# Patient Record
Sex: Female | Born: 1960
Health system: Southern US, Community
[De-identification: ages and names within clinical notes are randomized; demographics above are authoritative.]

## PROBLEM LIST (undated history)

## (undated) DIAGNOSIS — E119 Type 2 diabetes mellitus without complications: Secondary | ICD-10-CM

## (undated) DIAGNOSIS — I10 Essential (primary) hypertension: Secondary | ICD-10-CM

## (undated) HISTORY — DX: Essential (primary) hypertension: I10

## (undated) HISTORY — DX: Type 2 diabetes mellitus without complications: E11.9

---

## 2001-12-04 ENCOUNTER — Emergency Department (HOSPITAL_COMMUNITY): Admission: EM | Admit: 2001-12-04 | Discharge: 2001-12-04 | Payer: Self-pay | Admitting: Emergency Medicine

## 2001-12-04 ENCOUNTER — Encounter: Payer: Self-pay | Admitting: Emergency Medicine

## 2002-01-17 ENCOUNTER — Encounter: Payer: Self-pay | Admitting: Family Medicine

## 2002-01-17 ENCOUNTER — Ambulatory Visit (HOSPITAL_COMMUNITY): Admission: RE | Admit: 2002-01-17 | Discharge: 2002-01-17 | Payer: Self-pay | Admitting: Family Medicine

## 2002-08-10 ENCOUNTER — Encounter: Payer: Self-pay | Admitting: Internal Medicine

## 2002-08-10 ENCOUNTER — Encounter: Admission: RE | Admit: 2002-08-10 | Discharge: 2002-08-10 | Payer: Self-pay | Admitting: Internal Medicine

## 2002-08-28 ENCOUNTER — Encounter: Admission: RE | Admit: 2002-08-28 | Discharge: 2002-08-28 | Payer: Self-pay | Admitting: Internal Medicine

## 2002-08-28 ENCOUNTER — Encounter: Payer: Self-pay | Admitting: Internal Medicine

## 2012-07-13 LAB — HEPATIC FUNCTION PANEL
ALT: 17 U/L (ref 7–35)
AST: 17 U/L (ref 13–35)
Alkaline Phosphatase: 75 U/L (ref 25–125)
Bilirubin, Total: 0.4 mg/dL

## 2012-07-13 LAB — BASIC METABOLIC PANEL
BUN: 16 mg/dL (ref 4–21)
Creatinine: 0.9 mg/dL (ref 0.5–1.1)

## 2012-07-13 LAB — TSH: TSH: 1.42 u[IU]/mL (ref 0.41–5.90)

## 2012-07-13 LAB — HEMOGLOBIN A1C: Hgb A1c MFr Bld: 7 % — AB (ref 4.0–6.0)

## 2012-07-13 LAB — LIPID PANEL
Cholesterol: 198 mg/dL (ref 0–200)
HDL: 74 mg/dL — AB (ref 35–70)
LDL Cholesterol: 107 mg/dL
Triglycerides: 84 mg/dL (ref 40–160)

## 2012-07-13 LAB — CBC AND DIFFERENTIAL
Platelets: 298 10*3/uL (ref 150–399)
WBC: 4.2 10^3/mL

## 2013-01-10 ENCOUNTER — Ambulatory Visit: Payer: Self-pay | Admitting: Family Medicine

## 2013-01-11 ENCOUNTER — Ambulatory Visit (INDEPENDENT_AMBULATORY_CARE_PROVIDER_SITE_OTHER): Payer: BC Managed Care – PPO

## 2013-01-11 ENCOUNTER — Ambulatory Visit: Payer: BC Managed Care – PPO

## 2013-01-11 ENCOUNTER — Encounter: Payer: Self-pay | Admitting: Physician Assistant

## 2013-01-11 ENCOUNTER — Other Ambulatory Visit: Payer: Self-pay | Admitting: Physician Assistant

## 2013-01-11 ENCOUNTER — Ambulatory Visit (INDEPENDENT_AMBULATORY_CARE_PROVIDER_SITE_OTHER): Payer: BC Managed Care – PPO | Admitting: Physician Assistant

## 2013-01-11 VITALS — BP 115/76 | HR 79 | Ht 65.0 in | Wt 185.0 lb

## 2013-01-11 DIAGNOSIS — M25561 Pain in right knee: Secondary | ICD-10-CM

## 2013-01-11 DIAGNOSIS — I10 Essential (primary) hypertension: Secondary | ICD-10-CM

## 2013-01-11 DIAGNOSIS — E119 Type 2 diabetes mellitus without complications: Secondary | ICD-10-CM

## 2013-01-11 DIAGNOSIS — M25551 Pain in right hip: Secondary | ICD-10-CM

## 2013-01-11 DIAGNOSIS — E785 Hyperlipidemia, unspecified: Secondary | ICD-10-CM

## 2013-01-11 DIAGNOSIS — M25569 Pain in unspecified knee: Secondary | ICD-10-CM

## 2013-01-11 DIAGNOSIS — M1610 Unilateral primary osteoarthritis, unspecified hip: Secondary | ICD-10-CM

## 2013-01-11 DIAGNOSIS — R52 Pain, unspecified: Secondary | ICD-10-CM

## 2013-01-11 DIAGNOSIS — M25559 Pain in unspecified hip: Secondary | ICD-10-CM

## 2013-01-11 DIAGNOSIS — M169 Osteoarthritis of hip, unspecified: Secondary | ICD-10-CM

## 2013-01-11 DIAGNOSIS — M7631 Iliotibial band syndrome, right leg: Secondary | ICD-10-CM

## 2013-01-11 DIAGNOSIS — M629 Disorder of muscle, unspecified: Secondary | ICD-10-CM

## 2013-01-11 MED ORDER — IBUPROFEN 800 MG PO TABS: 800.0000 mg | ORAL_TABLET | Freq: Three times a day (TID) | ORAL | Status: DC | PRN

## 2013-01-11 NOTE — Progress Notes (Signed)
Subjective:    Patient ID: Tammy Bray, female    DOB: February 11, 1961, 52 y.o.   MRN: [id number]  HPI Patient is a 52 yo female who presents to the clinic to establish care. PMH of hyperlipidemia, DM type II, HtN. She will need refills on her medication. Pt does not smoke and drinks alcohol occasionally. Family hx of MI, DM.   Pt is concerned about her right hip to right knee pain since august. She has not had imaging done. Pain radiates from hip to outside of right knee. Worse with lateral stretching. She had been walking all summer but had to stop due to pain. She does not remember an incident that caused the worsening pain. Knee is a dull ache. Denies any back pain. She did get a shot in the knee and helped knee pain but not radiating pain on outside of leg from hip.  HTN- well controlled on lisinopril. Denies any CP, palpiations, vision changes, or headaches.   Hyperlipidemia- controlled with crestor. Should be recent lab from previous provider.  DM- taking combination actos and metformin. Doing well. Not sure when last a1c was. Denies any hypoglycemia events. Denies any neuropathy or side effects.      Review of Systems  All other systems reviewed and are negative.       Objective:   Physical Exam  Constitutional: She is oriented to person, place, and time. She appears well-developed and well-nourished.  HENT:  Head: Normocephalic and atraumatic.  Cardiovascular: Normal rate, regular rhythm and normal heart sounds.   Pulmonary/Chest: Effort normal and breath sounds normal.  Neurological: She is alert and oriented to person, place, and time.  Skin: Skin is warm and dry.  Psychiatric: She has a normal mood and affect. Her behavior is normal.          Assessment & Plan:  Right hip pain/right knee pain/IT band syndrome- will get knee and hip imaging. I feel like from exam and symptoms she has some IT inflammation. Exercises were given. Ibuprofen 800mg  were given to use as  needed. Discussed icing and importance of stretching before exercise. Call if not improving in next 4 weeks.    DM- refilled medications. Recheck in 3 months and start management here.  HtN- refilled medications. Continue low salt diet.   Hyperlipidemia- refilled crestor. Will check yearly at CPE. Continue low fat diet and exercise.

## 2013-01-11 NOTE — Patient Instructions (Addendum)
Ibuprofen 800mg  up to three times a day. Would suggested to do for 2 weeks and then only as needed.  Icing can be very beneficial.   Iliotibial Band Syndrome with Rehab The iliotibial (IT) band is a tendon that connects the hip muscles to the shinbone (tibia) and to one of the bones of the pelvis (ileum). The IT band passes by the knee and is often irritated by the outer portion of the knee (lateral femoral condyle). A fluid filled sac (bursa) exists between the tendon and the bone, to cushion and reduce friction. Overuse of the tendon may cause excessive friction, which results in IT band syndrome. This condition involves inflammation of the bursa (bursitis) and/or inflammation of the IT band (tendinitis). SYMPTOMS   Pain, tenderness, swelling, warmth, or redness over the IT band, at the outer knee (above the joint).  Pain that travels up or down the thigh or leg.  Initially, pain at the beginning of an exercise, that decreases once warmed up. Eventually, pain throughout the activity, getting worse as the activity continues. May cause the athlete to stop in the middle of training or competing.  Pain that gets worse when running down hills or stairs, on banked tracks, or next to the curb on the street.  Pain that increases when the foot of the affected leg hits the ground.  Possibly, a crackling sound (crepitation) when the tendon or bursa is moved or touched. CAUSES  IT band syndrome is caused by irritation of the IT band and the underlying bursa. This eventually results in inflammation and pain. IT band syndrome is an overuse injury.  RISK INCREASES WITH:  Sports with repetitive knee-bending activities (distance running, cycling).  Incorrect training techniques, including sudden changes in the intensity, frequency, or duration of training.  Not enough rest between workouts.  Poor strength and flexibility, especially a tight IT band.  Failure to warm up properly before  activity.  Bow legs.  Arthritis of the knee. PREVENTION   Warm up and stretch properly before activity.  Allow for adequate recovery between workouts.  Maintain physical fitness:  Strength, flexibility, and endurance.  Cardiovascular fitness.  Learn and use proper training technique, including reducing running mileage, shortening stride, and avoiding running on hills and banked surfaces.  Wear arch supports (orthotics), if you have flat feet. PROGNOSIS  If treated properly, IT band syndrome usually goes away within 6 weeks of treatment. RELATED COMPLICATIONS   Longer healing time, if not properly treated, or if not given enough time to heal.  Recurring inflammation of the tendon and bursa, that may result in a chronic condition.  Recurring symptoms, if activity is resumed too soon, with overuse, with a direct blow, or with poor training technique.  Inability to complete training or competition. TREATMENT  Treatment first involves the use of ice and medicine, to reduce pain and inflammation. The use of strengthening and stretching exercises may help reduce pain with activity. These exercises may be performed at home or with a therapist. For individuals with flat feet, an arch support (orthotic) may be helpful. Some individuals find that wearing a knee sleeve or compression bandage around the knee during workouts provides some relief. Certain training techniques, such as adjusting stride length, avoiding running on hills or stairs, changing the direction you run on a circular or banked track, or changing the side of the road you run on, if you run next to the curb, may help decrease symptoms of IT band syndrome. Cyclists may need to  change the seat height or foot position on their bicycles. An injection of cortisone into the bursa may be recommended. Surgery to remove the inflamed bursa and/or part of the IT band is only considered after at least 6 months of non-surgical treatment.   MEDICATION   If pain medicine is needed, nonsteroidal anti-inflammatory medicines (aspirin and ibuprofen), or other minor pain relievers (acetaminophen), are often advised.  Do not take pain medicine for 7 days before surgery.  Prescription pain relievers may be given, if your caregiver thinks they are needed. Use only as directed and only as much as you need.  Corticosteroid injections may be given by your caregiver. These injections should be reserved for the most serious cases, because they may only be given a certain number of times. HEAT AND COLD  Cold treatment (icing) should be applied for 10 to 15 minutes every 2 to 3 hours for inflammation and pain, and immediately after activity that aggravates your symptoms. Use ice packs or an ice massage.  Heat treatment may be used before performing stretching and strengthening activities prescribed by your caregiver, physical therapist, or athletic trainer. Use a heat pack or a warm water soak. SEEK MEDICAL CARE IF:   Symptoms get worse or do not improve in 2 to 4 weeks, despite treatment.  New, unexplained symptoms develop. (Drugs used in treatment may produce side effects.) EXERCISES  RANGE OF MOTION (ROM) AND STRETCHING EXERCISES - Iliotibial Band Syndrome These exercises may help you when beginning to rehabilitate your injury. Your symptoms may go away with or without further involvement from your physician, physical therapist or athletic trainer. While completing these exercises, remember:   Restoring tissue flexibility helps normal motion to return to the joints. This allows healthier, less painful movement and activity.  An effective stretch should be held for at least 30 seconds.  A stretch should never be painful. You should only feel a gentle lengthening or release in the stretched tissue. STRETCH - Quadriceps, Prone   Lie on your stomach on a firm surface, such as a bed or padded floor.  Bend your right / left knee and  grasp your ankle. If you are unable to reach your ankle or pant leg, use a belt around your foot to lengthen your reach.  Gently pull your heel toward your buttocks. Your knee should not slide out to the side. You should feel a stretch in the front of your thigh and knee.  Hold this position for __________ seconds. Repeat __________ times. Complete this stretch __________ times per day.  STRETCH  Iliotibial Band  On the floor or bed, lie on your side, so your right / left leg is on top. Bend your knee and grab your ankle.  Slowly bring your knee back so that your thigh is in line with your trunk. Keep your heel at your buttocks and gently arch your back, so your head, shoulders and hips line up.  Slowly lower your leg so that your knee approaches the floor or bed, until you feel a gentle stretch on the outside of your right / left thigh. If you do not feel a stretch and your knee will not fall farther, place the heel of your opposite foot on top of your knee, and pull your thigh down farther.  Hold this stretch for __________ seconds. Repeat __________ times. Complete this stretch __________ times per day. STRENGTHENING EXERCISES - Iliotibial Band Syndrome Improving the flexibility of the IT band will best relieve your discomfort due  to IT band syndrome. Strengthening exercises, however, can help improve both muscle endurance and joint mechanics, reducing the factors that can contribute to this condition. Your physician, physical therapist or athletic trainer may provide you with exercises that train specific muscle groups that are especially weak. The following exercises target muscles that are often weak in people who have IT band syndrome. STRENGTH - Hip Abductors, Straight Leg Raises  Be aware of your form throughout the entire exercise, so that you exercise the correct muscles. Poor form means that you are not strengthening the correct muscles.  Lie on your side, so that your head,  shoulders, knee and hip line up. You may bend your lower knee to help maintain your balance. Your right / left leg should be on top.  Roll your hips slightly forward, so that your hips are stacked directly over each other and your right / left knee is facing forward.  Lift your top leg up 4-6 inches, leading with your heel. Be sure that your foot does not drift forward and that your knee does not roll toward the ceiling.  Hold this position for __________ seconds. You should feel the muscles in your outer hip lifting (you may not notice this until your leg begins to tire).  Slowly lower your leg to the starting position. Allow the muscles to fully relax before beginning the next repetition. Repeat __________ times. Complete this exercise __________ times per day.  STRENGTH - Quad/VMO, Isometric  Sit in a chair with your right / left knee slightly bent. With your fingertips, feel the VMO muscle (just above the inside of your knee). The VMO is important in controlling the position of your kneecap.  Keeping your fingertips on this muscle. Without actually moving your leg, attempt to drive your knee down, as if straightening your leg. You should feel your VMO tense. If you have a difficult time, you may wish to try the same exercise on your healthy knee first.  Tense this muscle as hard as you can, without increasing any knee pain.  Hold for __________ seconds. Relax the muscles slowly and completely between each repetition. Repeat __________ times. Complete this exercise __________ times per day.  Document Released: 03/02/2005 Document Revised: 05/25/2011 Document Reviewed: 06/14/2008 [facility] Patient Information 2014 [place], [place].

## 2013-01-13 ENCOUNTER — Encounter: Payer: Self-pay | Admitting: Physician Assistant

## 2013-01-13 DIAGNOSIS — E785 Hyperlipidemia, unspecified: Secondary | ICD-10-CM | POA: Insufficient documentation

## 2013-01-13 DIAGNOSIS — M7630 Iliotibial band syndrome, unspecified leg: Secondary | ICD-10-CM | POA: Insufficient documentation

## 2013-01-13 DIAGNOSIS — M25551 Pain in right hip: Secondary | ICD-10-CM | POA: Insufficient documentation

## 2013-01-13 DIAGNOSIS — E119 Type 2 diabetes mellitus without complications: Secondary | ICD-10-CM | POA: Insufficient documentation

## 2013-01-13 DIAGNOSIS — M25561 Pain in right knee: Secondary | ICD-10-CM | POA: Insufficient documentation

## 2013-01-13 DIAGNOSIS — I10 Essential (primary) hypertension: Secondary | ICD-10-CM | POA: Insufficient documentation

## 2013-05-30 ENCOUNTER — Encounter: Payer: Self-pay | Admitting: *Deleted

## 2013-05-31 ENCOUNTER — Telehealth: Payer: Self-pay | Admitting: *Deleted

## 2013-05-31 NOTE — Telephone Encounter (Signed)
Yes we added labs from 4/14 into our system.

## 2013-05-31 NOTE — Telephone Encounter (Signed)
Pt left message asking if you had received her records from her previous dr?

## 2013-06-01 NOTE — Telephone Encounter (Signed)
LMOM notifying pt. 

## 2013-06-28 ENCOUNTER — Telehealth: Payer: Self-pay | Admitting: *Deleted

## 2013-06-28 NOTE — Telephone Encounter (Signed)
Pt left message asking if you had received her previous records from a Dr. Allyne GeeSanders.

## 2013-06-28 NOTE — Telephone Encounter (Signed)
I see your last mammogram with her and last labs. It looks as if we did scan pertinent information. Is there any specific information you were referring to?

## 2013-06-29 NOTE — Telephone Encounter (Signed)
Left detailed message & asked pt to call back if she had any further questions.

## 2013-07-07 ENCOUNTER — Encounter: Payer: Self-pay | Admitting: Physician Assistant

## 2013-07-07 ENCOUNTER — Ambulatory Visit (INDEPENDENT_AMBULATORY_CARE_PROVIDER_SITE_OTHER): Payer: BC Managed Care – PPO | Admitting: Physician Assistant

## 2013-07-07 VITALS — BP 125/80 | HR 85 | Ht 65.0 in | Wt 191.0 lb

## 2013-07-07 DIAGNOSIS — Z1211 Encounter for screening for malignant neoplasm of colon: Secondary | ICD-10-CM

## 2013-07-07 DIAGNOSIS — Z79899 Other long term (current) drug therapy: Secondary | ICD-10-CM

## 2013-07-07 DIAGNOSIS — E785 Hyperlipidemia, unspecified: Secondary | ICD-10-CM

## 2013-07-07 DIAGNOSIS — I10 Essential (primary) hypertension: Secondary | ICD-10-CM

## 2013-07-07 DIAGNOSIS — E119 Type 2 diabetes mellitus without complications: Secondary | ICD-10-CM

## 2013-07-07 LAB — POCT GLYCOSYLATED HEMOGLOBIN (HGB A1C): Hemoglobin A1C: 7

## 2013-07-08 NOTE — Progress Notes (Addendum)
Subjective:    Patient ID: Tammy Bray, female    DOB: [birth year], 53 y.o.   MRN: [id number]  HPI DM- no problems or concerns. Does not check glucose. Taking actoplus daily. Denies any hypoglycemic events. Not exercising. Not really watching her diet. No SE from medications. No neuropathy. Not had eye exam this year. On ACe.  HTN- no CP, palpitations, headaches, vision changes. Taking medication daily.   Hyperlipidemia- needs recheck. Taking crestor.    Review of Systems     Objective:   Physical Exam  Constitutional: She is oriented to person, place, and time. She appears well-developed and well-nourished.  Obese.  HENT:  Head: Normocephalic and atraumatic.  Right Ear: External ear normal.  Left Ear: External ear normal.  Cardiovascular: Normal rate, regular rhythm and normal heart sounds.   Pulmonary/Chest: Effort normal and breath sounds normal. She has no wheezes.  Neurological: She is alert and oriented to person, place, and time.  Skin: Skin is dry.  Psychiatric: She has a normal mood and affect. Her behavior is normal.          Assessment & Plan:  DM- . Lab Results  Component Value Date   HGBA1C 7.0 07/07/2013  will not make any medication adjustments today. Pt reports some changes that could be made in diet to keep below 7. Will refer to nutritionist. Encouraged pt to continue to stay active. Encouraged eye exam. Continue on actoplus. Follow up in 3 months.    Hyperlipidemia- sent to lab to have rechecked today. Continue on crestor.    HTN- controlled refilled lisinopril.   Discussed needed some vaccination and health maintanence items. Will make appt for CPE.  Colon cancer screening ordered.

## 2013-07-10 NOTE — Addendum Note (Signed)
Addended by: Jomarie LongsBREEBACK, Virgle Arth L on: 07/10/2013 01:19 PM   Modules accepted: Orders

## 2013-07-17 ENCOUNTER — Encounter: Payer: Self-pay | Admitting: Physician Assistant

## 2013-07-17 ENCOUNTER — Other Ambulatory Visit (HOSPITAL_COMMUNITY)
Admission: RE | Admit: 2013-07-17 | Discharge: 2013-07-17 | Disposition: A | Discharge status: Home / Self Care | Payer: BC Managed Care – PPO | Source: Ambulatory Visit | Attending: Family Medicine | Admitting: Family Medicine

## 2013-07-17 ENCOUNTER — Ambulatory Visit (INDEPENDENT_AMBULATORY_CARE_PROVIDER_SITE_OTHER): Payer: BC Managed Care – PPO | Admitting: Physician Assistant

## 2013-07-17 VITALS — BP 110/72 | HR 75 | Ht 64.25 in | Wt 187.0 lb

## 2013-07-17 DIAGNOSIS — Z1211 Encounter for screening for malignant neoplasm of colon: Secondary | ICD-10-CM

## 2013-07-17 DIAGNOSIS — Z0000 Encounter for general adult medical examination without abnormal findings: Secondary | ICD-10-CM

## 2013-07-17 DIAGNOSIS — Z23 Encounter for immunization: Secondary | ICD-10-CM

## 2013-07-17 DIAGNOSIS — Z1239 Encounter for other screening for malignant neoplasm of breast: Secondary | ICD-10-CM

## 2013-07-17 DIAGNOSIS — E119 Type 2 diabetes mellitus without complications: Secondary | ICD-10-CM

## 2013-07-17 DIAGNOSIS — E785 Hyperlipidemia, unspecified: Secondary | ICD-10-CM

## 2013-07-17 DIAGNOSIS — Z1151 Encounter for screening for human papillomavirus (HPV): Secondary | ICD-10-CM | POA: Insufficient documentation

## 2013-07-17 DIAGNOSIS — Z01419 Encounter for gynecological examination (general) (routine) without abnormal findings: Secondary | ICD-10-CM | POA: Insufficient documentation

## 2013-07-17 NOTE — Patient Instructions (Addendum)
Will refer for colonoscopy.  Will get mammogram.  Will call with labs today.    Keeping You Healthy  Get These Tests  Blood Pressure- Have your blood pressure checked by your healthcare provider at least once a year.  Normal blood pressure is 120/80.   Weight- Have your body mass index (BMI) calculated to screen for obesity.  BMI is a measure of body fat based on height and weight.  You can calculate your own BMI at https://www.west-esparza.com/www.[email or web address]/bmi/  Cholesterol- Have your cholesterol checked every year.  Diabetes- Have your blood sugar checked every year if you have high blood pressure, high cholesterol, a family history of diabetes or if you are overweight.  Pap Smear- Have a pap smear every 1 to 3 years if you have been sexually active.  If you are older than 65 and recent pap smears have been normal you may not need additional pap smears.  In addition, if you have had a hysterectomy  For benign disease additional pap smears are not necessary.  Mammogram-Yearly mammograms are essential for early detection of breast cancer  Screening for Colon Cancer- Colonoscopy starting at age 53. Screening may begin sooner depending on your family history and other health conditions.  Follow up colonoscopy as directed by your Gastroenterologist.  Screening for Osteoporosis- Screening begins at age 53 with bone density scanning, sooner if you are at higher risk for developing Osteoporosis.  Get these medicines  Calcium with Vitamin D- Your body requires 1200-1500 mg of Calcium a day and [phone] IU of Vitamin D a day.  You can only absorb 500 mg of Calcium at a time therefore Calcium must be taken in 2 or 3 separate doses throughout the day.  Hormones- Hormone therapy has been associated with increased risk for certain cancers and heart disease.  Talk to your healthcare provider about if you need relief from menopausal symptoms.  Aspirin- Ask your healthcare provider about taking Aspirin to prevent Heart  Disease and Stroke.  Get these Immuniztions  Flu shot- Every fall  Pneumonia shot- Once after the age of 53; if you are younger ask your healthcare provider if you need a pneumonia shot.  Tetanus- Every ten years.  Zostavax- Once after the age of 53 to prevent shingles.  Take these steps  Don't smoke- Your healthcare provider can help you quit. For tips on how to quit, ask your healthcare provider or go to www.smokefree.gov or call 1-800 QUIT-NOW.  Be physically active- Exercise 5 days a week for a minimum of 30 minutes.  If you are not already physically active, start slow and gradually work up to 30 minutes of moderate physical activity.  Try walking, dancing, bike riding, swimming, etc.  Eat a healthy diet- Eat a variety of healthy foods such as fruits, vegetables, whole grains, low fat milk, low fat cheeses, yogurt, lean meats, chicken, fish, eggs, dried beans, tofu, etc.  For more information go to www.thenutritionsource.org  Dental visit- Brush and floss teeth twice daily; visit your dentist twice a year.  Eye exam- Visit your Optometrist or Ophthalmologist yearly.  Drink alcohol in moderation- Limit alcohol intake to one drink or less a day.  Never drink and drive.  Depression- Your emotional health is as important as your physical health.  If you're feeling down or losing interest in things you normally enjoy, please talk to your healthcare provider.  Seat Belts- can save your life; always wear one  Smoke/Carbon Monoxide detectors- These detectors need to be installed on  the appropriate level of your home.  Replace batteries at least once a year.  Violence- If anyone is threatening or hurting you, please tell your healthcare provider.  Living Will/ Health care power of attorney- Discuss with your healthcare provider and family.

## 2013-07-18 LAB — LIPID PANEL
Cholesterol: 169 mg/dL (ref 0–200)
HDL: 57 mg/dL (ref >39)
LDL Cholesterol: 101 mg/dL — ABNORMAL HIGH (ref 0–99)
Total CHOL/HDL Ratio: 3 Ratio
Triglycerides: 57 mg/dL (ref <150)
VLDL: 11 mg/dL (ref 0–40)

## 2013-07-18 LAB — COMPLETE METABOLIC PANEL WITH GFR
ALT: 43 U/L — ABNORMAL HIGH (ref 0–35)
AST: 34 U/L (ref 0–37)
Albumin: 4.1 g/dL (ref 3.5–5.2)
Alkaline Phosphatase: 75 U/L (ref 39–117)
BUN: 12 mg/dL (ref 6–23)
CO2: 28 mEq/L (ref 19–32)
Calcium: 9.1 mg/dL (ref 8.4–10.5)
Chloride: 107 mEq/L (ref 96–112)
Creat: 0.84 mg/dL (ref 0.50–1.10)
GFR, Est African American: >89 mL/min
GFR, Est Non African American: 80 mL/min
Glucose, Bld: 119 mg/dL — ABNORMAL HIGH (ref 70–99)
Potassium: 4.1 mEq/L (ref 3.5–5.3)
Sodium: 143 mEq/L (ref 135–145)
Total Bilirubin: 0.8 mg/dL (ref 0.2–1.2)
Total Protein: 6.9 g/dL (ref 6.0–8.3)

## 2013-07-18 NOTE — Addendum Note (Signed)
Addended by: Chalmers CaterUTTLE, Conor Lata H on: 07/18/2013 03:18 PM   Modules accepted: Orders

## 2013-07-18 NOTE — Progress Notes (Signed)
  Subjective:     Tammy Bray is a 53 y.o. female and is here for a comprehensive physical exam. The patient reports no problems.  History   Social History  . Marital Status: Married    Spouse Name: N/A    Number of Children: N/A  . Years of Education: N/A   Occupational History  . Not on file.   Social History Main Topics  . Smoking status: Never Smoker   . Smokeless tobacco: Not on file  . Alcohol Use: Yes  . Drug Use: No  . Sexual Activity: Yes   Other Topics Concern  . Not on file   Social History Narrative  . No narrative on file   Health Maintenance  Topic Date Due  . Pneumococcal Polysaccharide Vaccine (##1) 05/25/1962  . Foot Exam  05/25/1970  . Urine Microalbumin  05/25/1970  . Colonoscopy  05/25/2010  . Ophthalmology Exam  05/14/2013  . Influenza Vaccine  10/14/2013  . Hemoglobin A1c  01/06/2014  . Mammogram  05/25/2014  . Pap Smear  05/25/2015  . Tetanus/tdap  03/16/2021    The following portions of the patient's history were reviewed and updated as appropriate: allergies, current medications, past family history, past medical history, past social history, past surgical history and problem list.  Review of Systems A comprehensive review of systems was negative.   Objective:    BP 110/72  Pulse 75  Ht 5' 4.25" (1.632 m)  Wt 187 lb (84.823 kg)  BMI 31.85 kg/m2 General appearance: alert, cooperative, appears stated age and moderately obese Head: Normocephalic, without obvious abnormality, atraumatic Eyes: conjunctivae/corneas clear. PERRL, EOM's intact. Fundi benign. Ears: normal TM's and external ear canals both ears Nose: Nares normal. Septum midline. Mucosa normal. No drainage or sinus tenderness. Throat: lips, mucosa, and tongue normal; teeth and gums normal Neck: no adenopathy, no carotid bruit, no JVD, supple, symmetrical, trachea midline and thyroid not enlarged, symmetric, no tenderness/mass/nodules Back: symmetric, no curvature. ROM  normal. No CVA tenderness. Lungs: clear to auscultation bilaterally Heart: regular rate and rhythm, S1, S2 normal, no murmur, click, rub or gallop Abdomen: soft, non-tender; bowel sounds normal; no masses,  no organomegaly Pelvic: cervix normal in appearance, external genitalia normal, no adnexal masses or tenderness, no cervical motion tenderness, uterus normal size, shape, and consistency and vagina normal without discharge Extremities: extremities normal, atraumatic, no cyanosis or edema Pulses: 2+ and symmetric Skin: Skin color, texture, turgor normal. No rashes or lesions Lymph nodes: Cervical, supraclavicular, and axillary nodes normal. Neurologic: Grossly normal    Assessment:    Healthy female exam.      Plan:    CPE- pap done today. Discussed with patient if normal we can start moving Pap smears out to every 3-5 years. Patient declines STD testing. Will order screening mammogram. Will order routine colonoscopy.  Prevnar vaccine was given today due to hight risk pt. Will give pnuemoccoal in 1 year. Encouraged patient to continue calcium and vitamin D 1200 mg and 800 units daily. Stay active with 150 minutes of exercise every week. Depression screening was negative.  Hyperlipidemia-lipid was recheck today patient is currently on Crestor for cholesterol.  Diabetes-was recently seen for management. Will check kidney function.   See After Visit Summary for Counseling Recommendations

## 2013-07-24 ENCOUNTER — Other Ambulatory Visit: Payer: Self-pay | Admitting: Physician Assistant

## 2013-08-04 ENCOUNTER — Other Ambulatory Visit: Payer: Self-pay | Admitting: Physician Assistant

## 2013-08-04 DIAGNOSIS — R748 Abnormal levels of other serum enzymes: Secondary | ICD-10-CM

## 2013-08-04 LAB — HM MAMMOGRAPHY

## 2013-08-05 LAB — COMPLETE METABOLIC PANEL WITH GFR
ALT: 16 U/L (ref 0–35)
AST: 17 U/L (ref 0–37)
Albumin: 4.2 g/dL (ref 3.5–5.2)
Alkaline Phosphatase: 70 U/L (ref 39–117)
BUN: 11 mg/dL (ref 6–23)
CO2: 28 mEq/L (ref 19–32)
Calcium: 9.6 mg/dL (ref 8.4–10.5)
Chloride: 105 mEq/L (ref 96–112)
Creat: 0.84 mg/dL (ref 0.50–1.10)
GFR, Est African American: >89 mL/min
GFR, Est Non African American: 80 mL/min
Glucose, Bld: 112 mg/dL — ABNORMAL HIGH (ref 70–99)
Potassium: 3.9 mEq/L (ref 3.5–5.3)
Sodium: 140 mEq/L (ref 135–145)
Total Bilirubin: 0.5 mg/dL (ref 0.2–1.2)
Total Protein: 7.4 g/dL (ref 6.0–8.3)

## 2013-08-05 LAB — LIPID PANEL
Cholesterol: 208 mg/dL — ABNORMAL HIGH (ref 0–200)
HDL: 56 mg/dL (ref >39)
LDL Cholesterol: 138 mg/dL — ABNORMAL HIGH (ref 0–99)
Total CHOL/HDL Ratio: 3.7 Ratio
Triglycerides: 69 mg/dL (ref <150)
VLDL: 14 mg/dL (ref 0–40)

## 2013-09-08 ENCOUNTER — Other Ambulatory Visit: Payer: Self-pay | Admitting: Physician Assistant

## 2013-10-18 ENCOUNTER — Other Ambulatory Visit: Payer: Self-pay | Admitting: Physician Assistant

## 2013-12-16 ENCOUNTER — Other Ambulatory Visit: Payer: Self-pay | Admitting: Physician Assistant

## 2014-01-17 ENCOUNTER — Other Ambulatory Visit: Payer: Self-pay | Admitting: Physician Assistant

## 2014-03-19 ENCOUNTER — Encounter: Payer: Self-pay | Admitting: Physician Assistant

## 2014-03-19 ENCOUNTER — Ambulatory Visit (INDEPENDENT_AMBULATORY_CARE_PROVIDER_SITE_OTHER): Payer: BC Managed Care – PPO | Admitting: Physician Assistant

## 2014-03-19 VITALS — BP 135/81 | HR 73 | Temp 98.4°F | Ht 64.0 in | Wt 191.0 lb

## 2014-03-19 DIAGNOSIS — M79675 Pain in left toe(s): Secondary | ICD-10-CM | POA: Diagnosis not present

## 2014-03-19 DIAGNOSIS — Z23 Encounter for immunization: Secondary | ICD-10-CM | POA: Diagnosis not present

## 2014-03-19 DIAGNOSIS — E119 Type 2 diabetes mellitus without complications: Secondary | ICD-10-CM | POA: Diagnosis not present

## 2014-03-19 LAB — POCT UA - MICROALBUMIN

## 2014-03-19 LAB — POCT GLYCOSYLATED HEMOGLOBIN (HGB A1C): Hemoglobin A1C: 7.2

## 2014-03-19 MED ORDER — ROSUVASTATIN CALCIUM 20 MG PO TABS: ORAL_TABLET | ORAL | Status: DC

## 2014-03-19 MED ORDER — LISINOPRIL 10 MG PO TABS: 10.0000 mg | ORAL_TABLET | Freq: Every day | ORAL | Status: DC

## 2014-03-19 MED ORDER — PIOGLITAZONE HCL-METFORMIN HCL 15-850 MG PO TABS: ORAL_TABLET | ORAL | Status: DC

## 2014-03-19 NOTE — Addendum Note (Signed)
Addended by: Corliss Skains D on: 03/19/2014 09:32 AM   Modules accepted: Orders

## 2014-03-19 NOTE — Patient Instructions (Signed)

## 2014-03-19 NOTE — Progress Notes (Signed)
Subjective:    Patient ID: Tammy Bray, female    DOB: March 12, 1961, 54 y.o.   MRN: [id number]  HPI Pt presents to the clinic for 3 month follow up on DM. Doing well with no concerns. Not checking sugars regularly. When she does only fastings and running around 130. No hypoglycemic events. She is having some off and on left great toe pain. This is after she stepped and cut her foot on shattered plate. No pain or numbness today. No CP, palpitations, headaches or vision changes.    Review of Systems  All other systems reviewed and are negative.      Objective:   Physical Exam  Constitutional: She is oriented to person, place, and time. She appears well-developed and well-nourished.  HENT:  Head: Normocephalic and atraumatic.  Eyes: Conjunctivae are normal.  Neck: Normal range of motion. Neck supple. No thyromegaly present.  Cardiovascular: Normal rate, regular rhythm and normal heart sounds.   Pulmonary/Chest: Effort normal and breath sounds normal. She has no wheezes.  Neurological: She is alert and oriented to person, place, and time.  Skin: Skin is dry.  Psychiatric: She has a normal mood and affect. Her behavior is normal.          Assessment & Plan:  DM- ... Lab Results  Component Value Date   HGBA1C 7.2 03/19/2014  up a little from 3 months ago.  Stay on same medications.  Discussed diet and exercise changes.  mirco done today.  Foot exam normal today.  Reminded of need for eye exam.  Follow up in 3 months.  Flu shot given today. Discussed need for pneumonia vaccine. Pt declined will wait for next visit.   HTN- refilled for 6 months.   Left great toe pain- not having any today. Suspect some nerve damage after stepping on broken plate. Foot exam normal today. Follow up as needed. If continues may need to evaluate lower back for radiculopathy pain.

## 2014-03-28 ENCOUNTER — Other Ambulatory Visit: Payer: Self-pay | Admitting: Physician Assistant

## 2014-06-18 ENCOUNTER — Ambulatory Visit: Payer: BC Managed Care – PPO | Admitting: Physician Assistant

## 2014-06-29 ENCOUNTER — Encounter: Payer: Self-pay | Admitting: Physician Assistant

## 2014-06-29 ENCOUNTER — Ambulatory Visit (INDEPENDENT_AMBULATORY_CARE_PROVIDER_SITE_OTHER): Payer: BC Managed Care – PPO | Admitting: Physician Assistant

## 2014-06-29 VITALS — BP 120/87 | HR 71 | Wt 190.0 lb

## 2014-06-29 DIAGNOSIS — E785 Hyperlipidemia, unspecified: Secondary | ICD-10-CM | POA: Diagnosis not present

## 2014-06-29 DIAGNOSIS — E119 Type 2 diabetes mellitus without complications: Secondary | ICD-10-CM | POA: Diagnosis not present

## 2014-06-29 DIAGNOSIS — Z79899 Other long term (current) drug therapy: Secondary | ICD-10-CM | POA: Diagnosis not present

## 2014-06-29 LAB — COMPLETE METABOLIC PANEL WITH GFR
ALT: 25 U/L (ref 0–35)
AST: 17 U/L (ref 0–37)
Albumin: 4.2 g/dL (ref 3.5–5.2)
Alkaline Phosphatase: 72 U/L (ref 39–117)
BUN: 11 mg/dL (ref 6–23)
CO2: 28 mEq/L (ref 19–32)
Calcium: 9.5 mg/dL (ref 8.4–10.5)
Chloride: 104 mEq/L (ref 96–112)
Creat: 0.95 mg/dL (ref 0.50–1.10)
GFR, Est African American: 79 mL/min
GFR, Est Non African American: 68 mL/min
Glucose, Bld: 111 mg/dL — ABNORMAL HIGH (ref 70–99)
Potassium: 4.3 mEq/L (ref 3.5–5.3)
Sodium: 142 mEq/L (ref 135–145)
Total Bilirubin: 0.6 mg/dL (ref 0.2–1.2)
Total Protein: 7.1 g/dL (ref 6.0–8.3)

## 2014-06-29 LAB — LIPID PANEL
Cholesterol: 152 mg/dL (ref 0–200)
HDL: 56 mg/dL (ref >=46)
LDL Cholesterol: 83 mg/dL (ref 0–99)
Total CHOL/HDL Ratio: 2.7 Ratio
Triglycerides: 67 mg/dL (ref <150)
VLDL: 13 mg/dL (ref 0–40)

## 2014-06-29 LAB — HEMOGLOBIN A1C
Hgb A1c MFr Bld: 7.2 % — ABNORMAL HIGH (ref <5.7)
Mean Plasma Glucose: 160 mg/dL — ABNORMAL HIGH (ref <117)

## 2014-06-29 LAB — POCT GLYCOSYLATED HEMOGLOBIN (HGB A1C): Hemoglobin A1C: 7.7

## 2014-06-29 NOTE — Progress Notes (Signed)
Subjective:    Patient ID: Tammy Bray, female    DOB: Jan 06, 1961, 54 y.o.   MRN: [id number]  HPI  Pt presents to the clinic to follow up on diabetes. She feels like she is doing better on dieting. Fasting running 115-130 but during the day glucose frequently 115. Not exercising. Denies any hypoglycemic events. No vision changes no neuropathy. No diabetic side effects. She has eye exam scheduled for next week.    Review of Systems  All other systems reviewed and are negative.      Objective:   Physical Exam  Constitutional: She is oriented to person, place, and time. She appears well-developed and well-nourished.  HENT:  Head: Normocephalic and atraumatic.  Cardiovascular: Normal rate, regular rhythm and normal heart sounds.   Pulmonary/Chest: Effort normal and breath sounds normal. She has no wheezes.  Neurological: She is alert and oriented to person, place, and time.  Skin: Skin is dry.  Psychiatric: She has a normal mood and affect. Her behavior is normal.          Assessment & Plan:  DM-.[name] Kitchen A1C was  7.7  Point of care in office.   Per pt she is not convience that sugar is worse. She wants to recheck with lab downstairs before adding another medication.  Lab Results  Component Value Date   HGBA1C 7.2* 06/29/2014   Discussed faraxiga. Discussed side effects. Pt on board with starting if a1c still above 7.  Continue on crestor and lisinopril.  Up to date on mircoalbumin and foot exam.  Needs pneumonia shot but has not been year since prevnar.   Discussed addition of exercise.  Pt has eye exam scheduled aware we need copy of report.   Will check lipid and cmp.   Discussed need for colonoscopy. Pt wants to hold off until fall.

## 2014-06-30 LAB — HM DIABETES EYE EXAM

## 2014-07-01 ENCOUNTER — Telehealth: Payer: Self-pay | Admitting: Physician Assistant

## 2014-07-01 MED ORDER — DAPAGLIFLOZIN PROPANEDIOL 10 MG PO TABS: 10.0000 mg | ORAL_TABLET | Freq: Every day | ORAL | Status: DC

## 2014-07-01 NOTE — Telephone Encounter (Signed)
Can we please not charge for Point of care a1C sent to lab for confirmation of right reading.

## 2014-07-02 ENCOUNTER — Telehealth: Payer: Self-pay | Admitting: Physician Assistant

## 2014-07-02 NOTE — Telephone Encounter (Signed)
Received fax for Pa on farxiga 10mg  called to do Prior auth and the medication was denied due to patient hasnt failed metphormin and one other drug from another class they will mail determination - CF

## 2014-07-03 ENCOUNTER — Telehealth: Payer: Self-pay | Admitting: Physician Assistant

## 2014-07-03 NOTE — Telephone Encounter (Signed)
Called discount card into [facility] and Marcelline DeistFarxiga is approved with discount card - CF

## 2014-07-04 NOTE — Telephone Encounter (Signed)
Ok will do thanks.

## 2014-09-28 ENCOUNTER — Ambulatory Visit: Payer: BC Managed Care – PPO | Admitting: Family Medicine

## 2014-10-02 ENCOUNTER — Encounter: Payer: Self-pay | Admitting: *Deleted

## 2014-11-05 ENCOUNTER — Ambulatory Visit: Payer: BC Managed Care – PPO | Admitting: Physician Assistant

## 2014-11-14 ENCOUNTER — Ambulatory Visit: Payer: BLUE CROSS/BLUE SHIELD | Admitting: Physician Assistant

## 2014-11-23 ENCOUNTER — Encounter: Payer: Self-pay | Admitting: Physician Assistant

## 2014-11-23 ENCOUNTER — Ambulatory Visit (INDEPENDENT_AMBULATORY_CARE_PROVIDER_SITE_OTHER): Payer: BLUE CROSS/BLUE SHIELD | Admitting: Physician Assistant

## 2014-11-23 VITALS — BP 127/80 | HR 118 | Ht 64.0 in | Wt 188.0 lb

## 2014-11-23 DIAGNOSIS — R000 Tachycardia, unspecified: Secondary | ICD-10-CM

## 2014-11-23 DIAGNOSIS — E119 Type 2 diabetes mellitus without complications: Secondary | ICD-10-CM | POA: Diagnosis not present

## 2014-11-23 DIAGNOSIS — Z1159 Encounter for screening for other viral diseases: Secondary | ICD-10-CM | POA: Diagnosis not present

## 2014-11-23 DIAGNOSIS — Z23 Encounter for immunization: Secondary | ICD-10-CM

## 2014-11-23 DIAGNOSIS — Z114 Encounter for screening for human immunodeficiency virus [HIV]: Secondary | ICD-10-CM

## 2014-11-23 LAB — TSH: TSH: 1.92 u[IU]/mL (ref 0.350–4.500)

## 2014-11-23 LAB — CBC WITH DIFFERENTIAL/PLATELET
Basophils Absolute: 0 10*3/uL (ref 0.0–0.1)
Basophils Relative: 0 % (ref 0–1)
Eosinophils Absolute: 0.1 10*3/uL (ref 0.0–0.7)
Eosinophils Relative: 2 % (ref 0–5)
HCT: 37.8 % (ref 36.0–46.0)
Hemoglobin: 12.5 g/dL (ref 12.0–15.0)
Lymphocytes Relative: 46 % (ref 12–46)
Lymphs Abs: 2.4 10*3/uL (ref 0.7–4.0)
MCH: 26.2 pg (ref 26.0–34.0)
MCHC: 33.1 g/dL (ref 30.0–36.0)
MCV: 79.2 fL (ref 78.0–100.0)
MPV: 8.8 fL (ref 8.6–12.4)
Monocytes Absolute: 0.4 10*3/uL (ref 0.1–1.0)
Monocytes Relative: 8 % (ref 3–12)
Neutro Abs: 2.3 10*3/uL (ref 1.7–7.7)
Neutrophils Relative %: 44 % (ref 43–77)
Platelets: 293 10*3/uL (ref 150–400)
RBC: 4.77 MIL/uL (ref 3.87–5.11)
RDW: 14.5 % (ref 11.5–15.5)
WBC: 5.3 10*3/uL (ref 4.0–10.5)

## 2014-11-23 LAB — FERRITIN: Ferritin: 130 ng/mL (ref 10–291)

## 2014-11-23 LAB — POCT GLYCOSYLATED HEMOGLOBIN (HGB A1C): Hemoglobin A1C: 7.5

## 2014-11-23 MED ORDER — PIOGLITAZONE HCL 15 MG PO TABS: 15.0000 mg | ORAL_TABLET | Freq: Every day | ORAL | Status: DC

## 2014-11-23 MED ORDER — DAPAGLIFLOZIN PROPANEDIOL 10 MG PO TABS: 10.0000 mg | ORAL_TABLET | Freq: Every day | ORAL | Status: DC

## 2014-11-23 NOTE — Progress Notes (Signed)
Subjective:    Patient ID: Tammy Bray, female    DOB: 12-25-1960, 54 y.o.   MRN: [id number]  HPI DM- not checking sugars. Not taking faraxiga. She thought it was only for one month. She does not take actoplus every day twice a day because pill too big. No hypoglycemic events. She is trying to stick to a low carb/sugar diet. Not exercising.    Denies CP, heart racing, palpitations. She has had some fatigue.   Review of Systems  All other systems reviewed and are negative.      Objective:   Physical Exam  Constitutional: She is oriented to person, place, and time. She appears well-developed and well-nourished.  HENT:  Head: Normocephalic and atraumatic.  Cardiovascular: Regular rhythm and normal heart sounds.   Tachycardia at 118.   Pulmonary/Chest: Effort normal and breath sounds normal.  Neurological: She is alert and oriented to person, place, and time.  Skin: Skin is dry.  Psychiatric: She has a normal mood and affect. Her behavior is normal.          Assessment & Plan:  DM- .[name] Kitchen Lab Results  Component Value Date   HGBA1C 7.5 11/23/2014   Not taking medication as directed. Not at goal. Restart farxiga. Discussed SE"s.  Stop actoplus since not taking due to pill size.  Start actos only.  Flu shot given.  On ACE.  Follow up in 3 months.  Hyperlipidemia- recheck lipid.     Tachycardia- unclear etiology. Will check TSH, ferritin, CBC. Recheck in 4 weeks.

## 2014-11-24 LAB — HIV ANTIBODY (ROUTINE TESTING W REFLEX): HIV 1&2 Ab, 4th Generation: NONREACTIVE

## 2014-11-24 LAB — HEPATITIS C ANTIBODY: HCV Ab: NEGATIVE

## 2014-11-25 DIAGNOSIS — R000 Tachycardia, unspecified: Secondary | ICD-10-CM | POA: Insufficient documentation

## 2014-12-07 ENCOUNTER — Ambulatory Visit (INDEPENDENT_AMBULATORY_CARE_PROVIDER_SITE_OTHER): Payer: BLUE CROSS/BLUE SHIELD | Admitting: Physician Assistant

## 2014-12-07 ENCOUNTER — Encounter: Payer: Self-pay | Admitting: Physician Assistant

## 2014-12-07 VITALS — BP 112/81 | HR 83 | Ht 64.0 in | Wt 189.0 lb

## 2014-12-07 DIAGNOSIS — R000 Tachycardia, unspecified: Secondary | ICD-10-CM

## 2014-12-07 NOTE — Progress Notes (Signed)
Subjective:    Patient ID: Tammy Bray, female    DOB: 20-Apr-1960, 54 y.o.   MRN: [id number]  HPI  Patient is a 54 year old female who presents to the clinic to follow-up on tachycardia. It was an unclear etiology at time of visit. Today we rechecked an 83. Patient denies any chest pains, palpitations, dizziness, heart racing, dizziness.   Review of Systems  All other systems reviewed and are negative.      Objective:   Physical Exam  Constitutional: She is oriented to person, place, and time. She appears well-developed and well-nourished.  HENT:  Head: Normocephalic and atraumatic.  Cardiovascular: Normal rate, regular rhythm and normal heart sounds.   Pulmonary/Chest: Effort normal and breath sounds normal.  Neurological: She is alert and oriented to person, place, and time.  Skin: Skin is dry.  Psychiatric: She has a normal mood and affect. Her behavior is normal.          Assessment & Plan:  Tachycardia- pulse rate today was 83. This is great. Encouraged patient to keep a monitor on her pulse rate. We'll follow-up in 2 months with diabetes.

## 2014-12-14 ENCOUNTER — Encounter: Payer: Self-pay | Admitting: Physician Assistant

## 2014-12-14 ENCOUNTER — Ambulatory Visit (INDEPENDENT_AMBULATORY_CARE_PROVIDER_SITE_OTHER): Payer: BLUE CROSS/BLUE SHIELD | Admitting: Physician Assistant

## 2014-12-14 VITALS — BP 120/91 | HR 70 | Ht 64.0 in | Wt 187.0 lb

## 2014-12-14 DIAGNOSIS — M795 Residual foreign body in soft tissue: Secondary | ICD-10-CM | POA: Diagnosis not present

## 2014-12-14 NOTE — Progress Notes (Signed)
Subjective:    Patient ID: Tammy Bray, female    DOB: 1960-11-18, 54 y.o.   MRN: [id number]  HPI  Pt presents to the clinic with painful right ring finger with a piece of glass that she mostly removed last Saturday. She was cleaning up glass when it got stuck in finger. She feels like some more glass is trapped because very painful to touch. No discharge.     Review of Systems  All other systems reviewed and are negative.      Objective:   Physical Exam  Constitutional: She is oriented to person, place, and time. She appears well-developed and well-nourished.  HENT:  Head: Normocephalic and atraumatic.  Cardiovascular: Normal rate and normal heart sounds.   Pulmonary/Chest: Effort normal and breath sounds normal.  Neurological: She is alert and oriented to person, place, and time.  Skin:  Small abrasion with no visible glass on exam of right ring finger.   Abrasion was cleaned with alcohol. 11 blade was used to open up abrasion and small shard of glass removed.    Psychiatric: She has a normal mood and affect. Her behavior is normal.          Assessment & Plan:  Foreign object in right ring finger- Removed glass shard in office today. bactroban was placed and wrapped. Signs of infection discussed with patient follow up as needed.

## 2015-03-08 ENCOUNTER — Ambulatory Visit (INDEPENDENT_AMBULATORY_CARE_PROVIDER_SITE_OTHER): Payer: BLUE CROSS/BLUE SHIELD | Admitting: Physician Assistant

## 2015-03-08 ENCOUNTER — Encounter: Payer: Self-pay | Admitting: Physician Assistant

## 2015-03-08 VITALS — BP 123/79 | HR 86 | Ht 64.0 in | Wt 186.0 lb

## 2015-03-08 DIAGNOSIS — M795 Residual foreign body in soft tissue: Secondary | ICD-10-CM

## 2015-03-08 DIAGNOSIS — E119 Type 2 diabetes mellitus without complications: Secondary | ICD-10-CM | POA: Diagnosis not present

## 2015-03-08 LAB — POCT GLYCOSYLATED HEMOGLOBIN (HGB A1C): Hemoglobin A1C: 7.2

## 2015-03-08 MED ORDER — PIOGLITAZONE HCL 15 MG PO TABS: 15.0000 mg | ORAL_TABLET | Freq: Every day | ORAL | Status: DC

## 2015-03-08 MED ORDER — DAPAGLIFLOZIN PROPANEDIOL 10 MG PO TABS: 10.0000 mg | ORAL_TABLET | Freq: Every day | ORAL | Status: DC

## 2015-03-08 NOTE — Progress Notes (Signed)
Subjective:    Patient ID: Tammy Bray, female    DOB: April 25, 1960, 54 y.o.   MRN: [id number]  HPI Pt presents to the clinic for 3 month follow up for diabetes. She is doing well. She is not checking sugars. She denies any wounds or unhealed lesions. No hypoglycemia. Tolerating actos and faraxiga well.   She continues to have some pain and discomfort in right distal finger where a few months back came in and a small piece of glass was removed from finger.    Review of Systems  All other systems reviewed and are negative.      Objective:   Physical Exam  Constitutional: She is oriented to person, place, and time. She appears well-developed and well-nourished.  HENT:  Head: Normocephalic and atraumatic.  Cardiovascular: Normal rate, regular rhythm and normal heart sounds.   Pulmonary/Chest: Effort normal and breath sounds normal. She has no wheezes.  Neurological: She is alert and oriented to person, place, and time.  Skin:  Right ring finger distal tip pain to palpation and roughness.   Psychiatric: She has a normal mood and affect. Her behavior is normal.          Assessment & Plan:  DM- .[name] Kitchen Lab Results  Component Value Date   HGBA1C 7.2 03/08/2015   A1C today and call call at 7.2 a little better. Discussed goal is under 7.0.  Same dose of actos and farxiga.  Cannot swallow metformin.  On ACE.  Discussed diet and exercise. If still not at 7 at next visit will change meds.  Follow up in 3 months.    Foreign object of soft tissue of distal tip of right ring finger-  DO NOT CHARGE for removal since only a small piece removed before and more removed today.   Incision and Drainage Procedure Note  Pre-operative Diagnosis: foreign object in distal right ring finger  Post-operative Diagnosis: same  Indications: pain  Anesthesia: none  Procedure Details  The procedure, risks and complications have been discussed in detail (including, but not limited to airway  compromise, infection, bleeding) with the patient, and the patient has signed consent to the procedure.  The skin was sterilely prepped and draped over the affected area in the usual fashion. After adequate local anesthesia, I&D with a #11 blade was performed on the distal right ring finger. Purulent drainage: absent. The patient was observed until stable.  Findings: 1cm long piece of what appears to be glass/fiberglass.  WUJ:WJXBJ    Condition: Tolerated procedure well   Complications: none.

## 2015-03-22 ENCOUNTER — Other Ambulatory Visit: Payer: Self-pay | Admitting: Physician Assistant

## 2015-03-24 ENCOUNTER — Other Ambulatory Visit: Payer: Self-pay | Admitting: Physician Assistant

## 2015-04-10 ENCOUNTER — Other Ambulatory Visit: Payer: Self-pay | Admitting: Physician Assistant

## 2015-04-24 ENCOUNTER — Other Ambulatory Visit: Payer: Self-pay | Admitting: Physician Assistant

## 2015-04-29 ENCOUNTER — Other Ambulatory Visit: Payer: Self-pay | Admitting: Physician Assistant

## 2015-04-30 ENCOUNTER — Other Ambulatory Visit: Payer: Self-pay | Admitting: Physician Assistant

## 2015-05-07 ENCOUNTER — Other Ambulatory Visit: Payer: Self-pay | Admitting: Physician Assistant

## 2015-05-12 ENCOUNTER — Encounter: Payer: Self-pay | Admitting: Physician Assistant

## 2015-05-14 MED ORDER — DAPAGLIFLOZIN PROPANEDIOL 10 MG PO TABS: ORAL_TABLET | ORAL | Status: DC

## 2015-05-14 MED ORDER — LISINOPRIL 10 MG PO TABS: ORAL_TABLET | ORAL | Status: DC

## 2015-05-14 MED ORDER — ROSUVASTATIN CALCIUM 20 MG PO TABS: 20.0000 mg | ORAL_TABLET | Freq: Every day | ORAL | Status: DC

## 2015-05-14 MED ORDER — PIOGLITAZONE HCL 15 MG PO TABS: 15.0000 mg | ORAL_TABLET | Freq: Every day | ORAL | Status: DC

## 2015-06-07 ENCOUNTER — Ambulatory Visit: Payer: BLUE CROSS/BLUE SHIELD | Admitting: Physician Assistant

## 2015-06-14 ENCOUNTER — Encounter: Payer: Self-pay | Admitting: Physician Assistant

## 2015-06-14 ENCOUNTER — Ambulatory Visit (INDEPENDENT_AMBULATORY_CARE_PROVIDER_SITE_OTHER): Payer: Managed Care, Other (non HMO) | Admitting: Physician Assistant

## 2015-06-14 VITALS — BP 119/66 | HR 70 | Wt 186.0 lb

## 2015-06-14 DIAGNOSIS — I10 Essential (primary) hypertension: Secondary | ICD-10-CM

## 2015-06-14 DIAGNOSIS — E1165 Type 2 diabetes mellitus with hyperglycemia: Secondary | ICD-10-CM

## 2015-06-14 DIAGNOSIS — E669 Obesity, unspecified: Secondary | ICD-10-CM

## 2015-06-14 DIAGNOSIS — E118 Type 2 diabetes mellitus with unspecified complications: Secondary | ICD-10-CM | POA: Diagnosis not present

## 2015-06-14 DIAGNOSIS — IMO0002 Reserved for concepts with insufficient information to code with codable children: Secondary | ICD-10-CM | POA: Insufficient documentation

## 2015-06-14 LAB — POCT GLYCOSYLATED HEMOGLOBIN (HGB A1C): Hemoglobin A1C: 7.2

## 2015-06-14 MED ORDER — DULAGLUTIDE 0.75 MG/0.5ML ~~LOC~~ SOAJ: SUBCUTANEOUS | Status: DC

## 2015-06-14 NOTE — Progress Notes (Signed)
Subjective:    Patient ID: Tammy Bray, female    DOB: 1960-07-11, 55 y.o.   MRN: [id number]  HPI Pt is a 55 yo female who presents to the clinic for DM follow up. She is taking actos and faraxiga. She denies any hypoglycemic events. She denies any neuropathy or open wounds or sores. She is not checking sugars. She admits she is not exercising or eating like she should. She does want to lose weight.    Review of Systems  All other systems reviewed and are negative.      Objective:   Physical Exam  Constitutional: She is oriented to person, place, and time. She appears well-developed and well-nourished.  HENT:  Head: Normocephalic and atraumatic.  Cardiovascular: Normal rate, regular rhythm and normal heart sounds.   Pulmonary/Chest: Effort normal and breath sounds normal. She has no wheezes.  Neurological: She is alert and oriented to person, place, and time.  Psychiatric: She has a normal mood and affect. Her behavior is normal.          Assessment & Plan:  DM, type II, uncontrolled/obesity-   .[name] Kitchen Lab Results  Component Value Date   HGBA1C 7.2 06/14/2015   Stable but still up and diet changes have not helped.  Added trulicity to help with weight loss and glucose as well.  Discussed how to use and given coupon card.  Continue actos and farxagia.  On ACE.  Follow up in 3 months.  Discussed pnuemovax pt declined will discuss at next visit.    HTN- doing great. Continue on lisinopril.

## 2015-08-09 ENCOUNTER — Encounter: Payer: Self-pay | Admitting: Emergency Medicine

## 2015-08-09 ENCOUNTER — Emergency Department (INDEPENDENT_AMBULATORY_CARE_PROVIDER_SITE_OTHER): Payer: Managed Care, Other (non HMO)

## 2015-08-09 ENCOUNTER — Emergency Department (INDEPENDENT_AMBULATORY_CARE_PROVIDER_SITE_OTHER)
Admission: EM | Admit: 2015-08-09 | Discharge: 2015-08-09 | Disposition: A | Discharge status: Home / Self Care | Payer: Managed Care, Other (non HMO) | Source: Home / Self Care | Attending: Family Medicine | Admitting: Family Medicine

## 2015-08-09 DIAGNOSIS — M79642 Pain in left hand: Secondary | ICD-10-CM

## 2015-08-09 DIAGNOSIS — M654 Radial styloid tenosynovitis [de Quervain]: Secondary | ICD-10-CM | POA: Diagnosis not present

## 2015-08-09 MED ORDER — MELOXICAM 15 MG PO TABS: 15.0000 mg | ORAL_TABLET | Freq: Every day | ORAL | Status: DC

## 2015-08-09 NOTE — Discharge Instructions (Signed)
Apply ice pack for 20 to 30 minutes, 3 to 4 times daily  Continue until pain decreases.  Wear splint.  Begin range of motion and stretching exercises as tolerated.   De Quervain Tenosynovitis Tendons attach muscles to bones. They also help with joint movements. When tendons become irritated or swollen, it is called tendinitis. The extensor pollicis brevis (EPB) tendon connects the EPB muscle to a bone that is near the base of the thumb. The EPB muscle helps to straighten and extend the thumb. De Quervain tenosynovitis is a condition in which the EPB tendon lining (sheath) becomes irritated, thickened, and swollen. This condition is sometimes called stenosing tenosynovitis. This condition causes pain on the thumb side of the back of the wrist. CAUSES Causes of this condition include:  Activities that repeatedly cause your thumb and wrist to extend.  A sudden increase in activity or change in activity that affects your wrist. RISK FACTORS: This condition is more likely to develop in:  Females.  People who have diabetes.  Women who have recently given birth.  People who are over 10 years of age.  People who do activities that involve repeated hand and wrist motions, such as tennis, racquetball, volleyball, gardening, and taking care of children.  People who do heavy labor.  People who have poor wrist strength and flexibility.  People who do not warm up properly before activities. SYMPTOMS Symptoms of this condition include:  Pain or tenderness over the thumb side of the back of the wrist when your thumb and wrist are not moving.  Pain that gets worse when you straighten your thumb or extend your thumb or wrist.  Pain when the injured area is touched.  Locking or catching of the thumb joint while you bend and straighten your thumb.  Decreased thumb motion due to pain.  Swelling over the affected area. DIAGNOSIS This condition is diagnosed with a medical history and physical  exam. Your health care provider will ask for details about your injury and ask about your symptoms. TREATMENT Treatment may include the use of icing and medicines to reduce pain and swelling. You may also be advised to wear a splint or brace to limit your thumb and wrist motion. In less severe cases, treatment may also include working with a physical therapist to strengthen your wrist and calm the irritation around your EPB tendon sheath. In severe cases, surgery may be needed. HOME CARE INSTRUCTIONS If You Have a Splint or Brace:  Wear it as told by your health care provider. Remove it only as told by your health care provider.  Loosen the splint or brace if your fingers become numb and tingle, or if they turn cold and blue.  Keep the splint or brace clean and dry. Managing Pain, Stiffness, and Swelling   If directed, apply ice to the injured area.  Put ice in a plastic bag.  Place a towel between your skin and the bag.  Leave the ice on for 20 minutes, 2-3 times per day.  Move your fingers often to avoid stiffness and to lessen swelling.  Raise (elevate) the injured area above the level of your heart while you are sitting or lying down. General Instructions  Return to your normal activities as told by your health care provider. Ask your health care provider what activities are safe for you.  Take over-the-counter and prescription medicines only as told by your health care provider.  Keep all follow-up visits as told by your health care provider.  This is important.  Do not drive or operate heavy machinery while taking prescription pain medicine. SEEK MEDICAL CARE IF:  Your pain, tenderness, or swelling gets worse, even if you have had treatment.  You have numbness or tingling in your wrist, hand, or fingers on the injured side.   This information is not intended to replace advice given to you by your health care provider. Make sure you discuss any questions you have with your  health care provider.   Document Released: 03/02/2005 Document Revised: 11/21/2014 Document Reviewed: 05/08/2014 Elsevier Interactive Patient Education [employer]

## 2015-08-09 NOTE — ED Provider Notes (Signed)
CSN: [id number]     Arrival date & time 08/09/15  1734 History   First MD Initiated Contact with Patient 08/09/15 1801     Chief Complaint  Patient presents with  . Hand Pain      HPI Comments: Patient complains of pain in her left thumb for about 3 months that became worse this week.  She recalls no injury.  Patient is a 55 y.o. female presenting with hand pain. The history is provided by the patient.  Hand Pain This is a new problem. Episode onset: 3 months ago. The problem occurs constantly. Exacerbated by: movement of left thumb. Nothing relieves the symptoms. She has tried nothing for the symptoms.    Past Medical History  Diagnosis Date  . Hypertension   . Diabetes mellitus without complication (HCC)    History reviewed. No pertinent past surgical history. Family History  Problem Relation Age of Onset  . Diabetes Mother   . Heart attack Father   . Diabetes Father    Social History  Substance Use Topics  . Smoking status: Never Smoker   . Smokeless tobacco: None  . Alcohol Use: Yes   OB History    No data available     Review of Systems  All other systems reviewed and are negative.   Allergies  Review of patient's allergies indicates no known allergies.  Home Medications   Prior to Admission medications   Medication Sig Start Date End Date Taking? Authorizing Provider  dapagliflozin propanediol (FARXIGA) 10 MG TABS tablet TAKE 10 MG BY MOUTH DAILY. 05/14/15   Jade L Breeback, PA-C  Dulaglutide (TRULICITY) 0.75 MG/0.5ML SOPN One  injection a week. 06/14/15   Jade L Breeback, PA-C  lisinopril (PRINIVIL,ZESTRIL) 10 MG tablet TAKE 1 TABLET (10 MG TOTAL) BY MOUTH DAILY. 05/14/15   Jade L Breeback, PA-C  meloxicam (MOBIC) 15 MG tablet Take 1 tablet (15 mg total) by mouth daily. Take with food each morning 08/09/15   Lattie Haw, MD  pioglitazone (ACTOS) 15 MG tablet Take 1 tablet (15 mg total) by mouth daily. 05/14/15   Jade L Breeback, PA-C  rosuvastatin (CRESTOR)  20 MG tablet Take 1 tablet (20 mg total) by mouth daily. 05/14/15   Jomarie Longs, PA-C   Meds Ordered and Administered this Visit  Medications - No data to display  BP 133/87 mmHg  Pulse 81  Temp(Src) 98.3 F (36.8 C) (Oral)  Wt 183 lb (83.008 kg)  SpO2 100% No data found.   Physical Exam  Constitutional: She is oriented to person, place, and time. She appears well-developed and well-nourished. No distress.  HENT:  Head: Normocephalic.  Eyes: Conjunctivae are normal. Pupils are equal, round, and reactive to light.  Neck: Normal range of motion.  Musculoskeletal:       Left hand: She exhibits tenderness. She exhibits normal range of motion, no bony tenderness, normal two-point discrimination, normal capillary refill, no deformity, no laceration and no swelling.       Hands: Left thumb/wrist dorsally have distinct tenderness over the extensor tendons.  Palpation there during resisted extension and abduction of the thumb recreate her pain.  Distal neurovascular function is intact.    Neurological: She is alert and oriented to person, place, and time.  Skin: Skin is warm and dry. No rash noted.  Nursing note and vitals reviewed.   ED Course  Procedures none   Imaging Review Dg Hand Complete Left  08/09/2015  CLINICAL DATA:  Left hand pain  for several weeks, no known injury, initial encounter EXAM: LEFT HAND - COMPLETE 3+ VIEW COMPARISON:  None. FINDINGS: Degenerative changes are noted at the [facility]. Extra bony densities are noted adjacent to the joint space which may represent sequela from prior trauma. No acute fracture is seen. No gross soft tissue abnormality is noted. IMPRESSION: Degenerative change without acute abnormality. Electronically Signed   By: Alcide CleverMark  Lukens M.D.   On: 08/09/2015 18:33      MDM   1. Tommi Rumpse Quervain's tenosynovitis, left    Begin Mobic 15mg  daily. Apply ice pack for 20 to 30 minutes, 3 to 4 times daily  Continue until pain decreases.  Wear  splint.  Begin range of motion and stretching exercises as tolerated. Followup with Dr. Rodney Langtonhomas Thekkekandam or Dr. Clementeen GrahamEvan Corey ([facility]) if not improving about two weeks.     Lattie HawStephen A Madhav Mohon, MD 08/18/15 1047

## 2015-08-09 NOTE — ED Notes (Signed)
Pt c/o left thumb pain for about 3 months. Pain worsened this week. Denies injury..states pain is constant and no worse with movement.

## 2015-09-13 ENCOUNTER — Ambulatory Visit: Payer: BC Managed Care – PPO | Admitting: Physician Assistant

## 2015-09-18 ENCOUNTER — Ambulatory Visit: Payer: Managed Care, Other (non HMO) | Admitting: Physician Assistant

## 2015-09-23 ENCOUNTER — Ambulatory Visit (INDEPENDENT_AMBULATORY_CARE_PROVIDER_SITE_OTHER): Payer: Managed Care, Other (non HMO) | Admitting: Physician Assistant

## 2015-09-23 ENCOUNTER — Encounter: Payer: Self-pay | Admitting: Physician Assistant

## 2015-09-23 VITALS — BP 110/72 | HR 72 | Wt 188.0 lb

## 2015-09-23 DIAGNOSIS — E669 Obesity, unspecified: Secondary | ICD-10-CM

## 2015-09-23 DIAGNOSIS — M7632 Iliotibial band syndrome, left leg: Secondary | ICD-10-CM

## 2015-09-23 DIAGNOSIS — E119 Type 2 diabetes mellitus without complications: Secondary | ICD-10-CM | POA: Diagnosis not present

## 2015-09-23 LAB — POCT GLYCOSYLATED HEMOGLOBIN (HGB A1C): Hemoglobin A1C: 6.8

## 2015-09-23 LAB — HM MAMMOGRAPHY

## 2015-09-23 MED ORDER — PHENTERMINE HCL 37.5 MG PO TABS: 37.5000 mg | ORAL_TABLET | Freq: Every day | ORAL | Status: DC

## 2015-09-23 NOTE — Patient Instructions (Signed)
Iliotibial Band Syndrome With Rehab The iliotibial (IT) band is a tendon that connects the hip muscles to the shinbone (tibia) and to one of the bones of the pelvis (ileum). The IT band passes by the knee and is often irritated by the outer portion of the knee (lateral femoral condyle). A fluid filled sac (bursa) exists between the tendon and the bone, to cushion and reduce friction. Overuse of the tendon may cause excessive friction, which results in IT band syndrome. This condition involves inflammation of the bursa (bursitis) and/or inflammation of the IT band (tendinitis). SYMPTOMS   Pain, tenderness, swelling, warmth, or redness over the IT band, at the outer knee (above the joint).  Pain that travels up or down the thigh or leg.  Initially, pain at the beginning of an exercise, that decreases once warmed up. Eventually, pain throughout the activity, getting worse as the activity continues. May cause the athlete to stop in the middle of training or competing.  Pain that gets worse when running down hills or stairs, on banked tracks, or next to the curb on the street.  Pain that increases when the foot of the affected leg hits the ground.  Possibly, a crackling sound (crepitation) when the tendon or bursa is moved or touched. CAUSES  IT band syndrome is caused by irritation of the IT band and the underlying bursa. This eventually results in inflammation and pain. IT band syndrome is an overuse injury.  RISK INCREASES WITH:  Sports with repetitive knee-bending activities (distance running, cycling).  Incorrect training techniques, including sudden changes in the intensity, frequency, or duration of training.  Not enough rest between workouts.  Poor strength and flexibility, especially a tight IT band.  Failure to warm up properly before activity.  Bow legs.  Arthritis of the knee. PREVENTION   Warm up and stretch properly before activity.  Allow for adequate recovery between  workouts.  Maintain physical fitness:  Strength, flexibility, and endurance.  Cardiovascular fitness.  Learn and use proper training technique, including reducing running mileage, shortening stride, and avoiding running on hills and banked surfaces.  Wear arch supports (orthotics), if you have flat feet. PROGNOSIS  If treated properly, IT band syndrome usually goes away within 6 weeks of treatment. RELATED COMPLICATIONS   Longer healing time, if not properly treated, or if not given enough time to heal.  Recurring inflammation of the tendon and bursa, that may result in a chronic condition.  Recurring symptoms, if activity is resumed too soon, with overuse, with a direct blow, or with poor training technique.  Inability to complete training or competition. TREATMENT  Treatment first involves the use of ice and medicine, to reduce pain and inflammation. The use of strengthening and stretching exercises may help reduce pain with activity. These exercises may be performed at home or with a therapist. For individuals with flat feet, an arch support (orthotic) may be helpful. Some individuals find that wearing a knee sleeve or compression bandage around the knee during workouts provides some relief. Certain training techniques, such as adjusting stride length, avoiding running on hills or stairs, changing the direction you run on a circular or banked track, or changing the side of the road you run on, if you run next to the curb, may help decrease symptoms of IT band syndrome. Cyclists may need to change the seat height or foot position on their bicycles. An injection of cortisone into the bursa may be recommended. Surgery to remove the inflamed bursa and/or part   of the IT band is only considered after at least 6 months of non-surgical treatment.  MEDICATION   If pain medicine is needed, nonsteroidal anti-inflammatory medicines (aspirin and ibuprofen), or other minor pain relievers  (acetaminophen), are often advised.  Do not take pain medicine for 7 days before surgery.  Prescription pain relievers may be given, if your caregiver thinks they are needed. Use only as directed and only as much as you need.  Corticosteroid injections may be given by your caregiver. These injections should be reserved for the most serious cases, because they may only be given a certain number of times. HEAT AND COLD  Cold treatment (icing) should be applied for 10 to 15 minutes every 2 to 3 hours for inflammation and pain, and immediately after activity that aggravates your symptoms. Use ice packs or an ice massage.  Heat treatment may be used before performing stretching and strengthening activities prescribed by your caregiver, physical therapist, or athletic trainer. Use a heat pack or a warm water soak. SEEK MEDICAL CARE IF:   Symptoms get worse or do not improve in 2 to 4 weeks, despite treatment.  New, unexplained symptoms develop. (Drugs used in treatment may produce side effects.) EXERCISES  RANGE OF MOTION (ROM) AND STRETCHING EXERCISES - Iliotibial Band Syndrome These exercises may help you when beginning to rehabilitate your injury. Your symptoms may go away with or without further involvement from your physician, physical therapist or athletic trainer. While completing these exercises, remember:   Restoring tissue flexibility helps normal motion to return to the joints. This allows healthier, less painful movement and activity.  An effective stretch should be held for at least 30 seconds.  A stretch should never be painful. You should only feel a gentle lengthening or release in the stretched tissue. STRETCH - Quadriceps, Prone   Lie on your stomach on a firm surface, such as a bed or padded floor.  Bend your right / left knee and grasp your ankle. If you are unable to reach your ankle or pant leg, use a belt around your foot to lengthen your reach.  Gently pull your heel  toward your buttocks. Your knee should not slide out to the side. You should feel a stretch in the front of your thigh and knee.  Hold this position for __________ seconds. Repeat __________ times. Complete this stretch __________ times per day.  STRETCH - Iliotibial Band  On the floor or bed, lie on your side, so your right / left leg is on top. Bend your knee and grab your ankle.  Slowly bring your knee back so that your thigh is in line with your trunk. Keep your heel at your buttocks and gently arch your back, so your head, shoulders and hips line up.  Slowly lower your leg so that your knee approaches the floor or bed, until you feel a gentle stretch on the outside of your right / left thigh. If you do not feel a stretch and your knee will not fall farther, place the heel of your opposite foot on top of your knee, and pull your thigh down farther.  Hold this stretch for __________ seconds. Repeat __________ times. Complete this stretch __________ times per day. STRENGTHENING EXERCISES - Iliotibial Band Syndrome Improving the flexibility of the IT band will best relieve your discomfort due to IT band syndrome. Strengthening exercises, however, can help improve both muscle endurance and joint mechanics, reducing the factors that can contribute to this condition. Your physician, physical   therapist or athletic trainer may provide you with exercises that train specific muscle groups that are especially weak. The following exercises target muscles that are often weak in people who have IT band syndrome. STRENGTH - Hip Abductors, Straight Leg Raises  Be aware of your form throughout the entire exercise, so that you exercise the correct muscles. Poor form means that you are not strengthening the correct muscles.  Lie on your side, so that your head, shoulders, knee and hip line up. You may bend your lower knee to help maintain your balance. Your right / left leg should be on top.  Roll your hips  slightly forward, so that your hips are stacked directly over each other and your right / left knee is facing forward.  Lift your top leg up 4-6 inches, leading with your heel. Be sure that your foot does not drift forward and that your knee does not roll toward the ceiling.  Hold this position for __________ seconds. You should feel the muscles in your outer hip lifting (you may not notice this until your leg begins to tire).  Slowly lower your leg to the starting position. Allow the muscles to fully relax before beginning the next repetition. Repeat __________ times. Complete this exercise __________ times per day.  STRENGTH - Quad/VMO, Isometric  Sit in a chair with your right / left knee slightly bent. With your fingertips, feel the VMO muscle (just above the inside of your knee). The VMO is important in controlling the position of your kneecap.  Keeping your fingertips on this muscle. Without actually moving your leg, attempt to drive your knee down, as if straightening your leg. You should feel your VMO tense. If you have a difficult time, you may wish to try the same exercise on your healthy knee first.  Tense this muscle as hard as you can, without increasing any knee pain.  Hold for __________ seconds. Relax the muscles slowly and completely between each repetition. Repeat __________ times. Complete this exercise __________ times per day.    This information is not intended to replace advice given to you by your health care provider. Make sure you discuss any questions you have with your health care provider.   Document Released: 03/02/2005 Document Revised: 03/23/2014 Document Reviewed: 06/14/2008 Elsevier Interactive Patient Education 2016 [employer]  

## 2015-09-23 NOTE — Progress Notes (Signed)
Subjective:    Patient ID: Veva Holes, female    DOB: September 28, 1960, 55 y.o.   MRN: [id number]  HPI  Pt returns for follow up for DM management. She denies polyuria, polydipsia, foot paresthesias, foot ulcers, N/V, and weight loss. No episodes of hypoglycemia. She is currently taking Actos as prescribed but did not start Trulicity because she does not want to give herself injections. She monitors blood glucose in the morning only and runs around 130-140 most days.  Eye exam has not been done because of gaps in insurance coverage. She requests a referral to a dietician. No other complaints today.   Review of Systems  Constitutional: Negative.   HENT: Negative.   Eyes: Negative.   Respiratory: Negative.   Cardiovascular: Negative.   Gastrointestinal: Negative.   Endocrine: Negative.   Genitourinary: Negative.   Musculoskeletal:       Left lateral leg pain that comes and goes but not currently bothersome   Neurological: Negative.   Hematological: Negative.   Psychiatric/Behavioral: Negative.        Objective:   Physical Exam  Constitutional: She is oriented to person, place, and time. She appears well-developed and well-nourished.  HENT:  Head: Normocephalic and atraumatic.  Cardiovascular: Normal rate, regular rhythm and normal heart sounds.   Pulmonary/Chest: Effort normal and breath sounds normal.  Abdominal: Soft. Bowel sounds are normal.  Musculoskeletal:  Left leg:  Normal ROM.  No pain or tenderness to palpation over left leg or greater trochanter.  5/5 strength.   Neurological: She is alert and oriented to person, place, and time.  Normal foot exam   Skin: Skin is warm and dry.  Nursing note and vitals reviewed.         Assessment & Plan:  DM type II- .[name] Kitchen Lab Results  Component Value Date   HGBA1C 6.8 09/23/2015   6.8 down from 7.2. Doing great.  Continue on farxiga and actos.  Foot exam normal.  Pt declined eye exam until she gets insurance.  On ACE  and ARB.  Referral made to nutritionist. Encouraged diabetic diet today.  prevnar 13 up to date.  Follow up in 3 months.    Pt has already been referred for colonoscopy. She states she will call and schedule when she gets back from vacation.   Obese- discussed diet and exercise. Phentermine was given. Discussed side effects. Follow up in 1 month for vital recheck via nurse visit.   IT band syndrome, left- gave exercises. NSAIDs as needed. Discussed heat and ice therapy. biofreeze can also help. Make sure to stretch before exercising. Follow up as needed.

## 2015-09-26 ENCOUNTER — Encounter: Payer: Self-pay | Admitting: Physician Assistant

## 2015-11-01 ENCOUNTER — Other Ambulatory Visit: Payer: Self-pay | Admitting: Physician Assistant

## 2015-11-05 ENCOUNTER — Encounter: Payer: Self-pay | Admitting: Physician Assistant

## 2015-11-05 ENCOUNTER — Ambulatory Visit (INDEPENDENT_AMBULATORY_CARE_PROVIDER_SITE_OTHER): Payer: Managed Care, Other (non HMO) | Admitting: Physician Assistant

## 2015-11-05 ENCOUNTER — Other Ambulatory Visit: Payer: Self-pay | Admitting: Physician Assistant

## 2015-11-05 VITALS — BP 112/73 | HR 65 | Ht 64.0 in | Wt 188.0 lb

## 2015-11-05 DIAGNOSIS — N3001 Acute cystitis with hematuria: Secondary | ICD-10-CM | POA: Diagnosis not present

## 2015-11-05 DIAGNOSIS — R3915 Urgency of urination: Secondary | ICD-10-CM

## 2015-11-05 DIAGNOSIS — R300 Dysuria: Secondary | ICD-10-CM

## 2015-11-05 LAB — POCT URINALYSIS DIPSTICK
Bilirubin, UA: NEGATIVE
Glucose, UA: NEGATIVE
Ketones, UA: NEGATIVE
Nitrite, UA: NEGATIVE
Protein, UA: NEGATIVE
Spec Grav, UA: 1.015
Urobilinogen, UA: 0.2
pH, UA: 5.5

## 2015-11-05 MED ORDER — CIPROFLOXACIN HCL 500 MG PO TABS: 500.0000 mg | ORAL_TABLET | Freq: Two times a day (BID) | ORAL | 0 refills | Status: DC

## 2015-11-05 NOTE — Progress Notes (Signed)
Subjective:    Patient ID: Tammy Bray, female    DOB: Sep 13, 1960, 55 y.o.   MRN: [id number]  HPI  Patient is a 55 year old female who presents to the clinic with 3 days of dysuria, urinary frequency and urgency. She denies any fever, chills, nausea, vomiting, abdominal pain. She's not tried anything to make better. She's been drinking more water. She does feel like some of her symptoms have improved today.   Review of Systems    see HPI.  Objective:   Physical Exam  Constitutional: She is oriented to person, place, and time. She appears well-developed and well-nourished.  HENT:  Head: Normocephalic and atraumatic.  Cardiovascular: Normal rate, regular rhythm and normal heart sounds.   Pulmonary/Chest: Effort normal and breath sounds normal.  No CVA tenderness  Abdominal: Soft. Bowel sounds are normal. She exhibits no distension and no mass. There is no tenderness. There is no rebound and no guarding.  Neurological: She is alert and oriented to person, place, and time.  Psychiatric: She has a normal mood and affect. Her behavior is normal.          Assessment & Plan:  Acute cystitis with hematuria-.. Results for orders placed or performed in visit on 11/05/15  POCT urinalysis dipstick  Result Value Ref Range   Color, UA light yellow    Clarity, UA slightly cloudy    Glucose, UA neg    Bilirubin, UA neg    Ketones, UA neg    Spec Grav, UA 1.015    Blood, UA moderate    pH, UA 5.5    Protein, UA neg    Urobilinogen, UA 0.2    Nitrite, UA neg    Leukocytes, UA moderate (2+) (A) Negative   Patient treated with Cipro for 3 days. Discussed symptomatic care at the [facility]. Handout given. Encouraged increasing hydration. Follow-up if no improvement.

## 2015-11-05 NOTE — Patient Instructions (Signed)

## 2015-11-13 ENCOUNTER — Encounter: Payer: Self-pay | Admitting: Skilled Nursing Facility1

## 2015-11-13 ENCOUNTER — Encounter: Payer: Managed Care, Other (non HMO) | Attending: Physician Assistant | Admitting: Skilled Nursing Facility1

## 2015-11-13 DIAGNOSIS — Z713 Dietary counseling and surveillance: Secondary | ICD-10-CM | POA: Diagnosis present

## 2015-11-13 DIAGNOSIS — E119 Type 2 diabetes mellitus without complications: Secondary | ICD-10-CM | POA: Diagnosis not present

## 2015-11-13 NOTE — Patient Instructions (Signed)
-  Always bring your meter with you everywhere you go -Always Properly dispose of your needles:  -Discard in a hard plastic/metal container with a lid (something the needle can't puncture)  -Write Do Not Recycle on the outside of the container  -Example: A laundry detergent bottle -Never use the same needle more than once -Eat 2-3 carbohydrate choices for each meal and 1 for each snack -A meal: carbohydrates, protein, vegetable -A snack: A Fruit OR Vegetable AND Protein  -Try to be more active -Always pay attention to your body keeping watchful of possible low blood sugar (below 70) or high blood sugar (above 200) -Check your feet every single day -Try to check your blood sugar 4 times a week: sometimes before breakfast and sometimes 2 hours after you have eaten

## 2015-11-13 NOTE — Progress Notes (Signed)
Diabetes Self-Management Education  Visit Type: First/Initial  Appt. Start Time: 4:30 Appt. End Time: 5:50  11/13/2015  [name], identified by name and date of birth, is a 55 y.o. female with a diagnosis of Diabetes: Type 2.   ASSESSMENT  Height 5\' 5"  (1.651 m), weight 187 lb (84.8 kg). Body mass index is 31.12 kg/m.      Diabetes Self-Management Education - 11/13/15 1641      Visit Information   Visit Type First/Initial     Initial Visit   Diabetes Type Type 2   Are you currently following a meal plan? No   Are you taking your medications as prescribed? Yes   Date Diagnosed 14 years ago     Health Coping   How would you rate your overall health? Good     Psychosocial Assessment   Patient Belief/Attitude about Diabetes Motivated to manage diabetes   Self-care barriers None   Self-management support Family;Friends   Patient Concerns Nutrition/Meal planning     Pre-Education Assessment   Patient understands the diabetes disease and treatment process. Needs Instruction   Patient understands incorporating nutritional management into lifestyle. Needs Instruction   Patient undertands incorporating physical activity into lifestyle. Needs Instruction   Patient understands using medications safely. Needs Instruction   Patient understands monitoring blood glucose, interpreting and using results Needs Instruction   Patient understands prevention, detection, and treatment of acute complications. Needs Instruction   Patient understands prevention, detection, and treatment of chronic complications. Needs Instruction   Patient understands how to develop strategies to address psychosocial issues. Needs Instruction   Patient understands how to develop strategies to promote health/change behavior. Needs Instruction     Complications   Last HgB A1C per patient/outside source 6.8 %   How often do you check your blood sugar? 0 times/day (not testing)   Have you had a dilated  eye exam in the past 12 months? Yes   Have you had a dental exam in the past 12 months? No   Are you checking your feet? Yes   How many days per week are you checking your feet? 1     Dietary Intake   Breakfast oatmeal---pancakes   Lunch salmon and broccoli----chicken salad   Snack (afternoon) orange---chips   Dinner fried chicken wings, scalloped potatoes, broccoli    Beverage(s) water, tea     Exercise   Exercise Type Light (walking / raking leaves)   How many days per week to you exercise? 2   How many minutes per day do you exercise? 30   Total minutes per week of exercise 60     Patient Education   Previous Diabetes Education Yes (please comment)  14 years ago-does not remeber what she learned   Disease state  Definition of diabetes, type 1 and 2, and the diagnosis of diabetes;Factors that contribute to the development of diabetes   Nutrition management  Role of diet in the treatment of diabetes and the relationship between the three main macronutrients and blood glucose level;Carbohydrate counting;Food label reading, portion sizes and measuring food.;Reviewed blood glucose goals for pre and post meals and how to evaluate the patients' food intake on their blood glucose level.;Meal timing in regards to the patients' current diabetes medication.;Meal options for control of blood glucose level and chronic complications.   Physical activity and exercise  Role of exercise on diabetes management, blood pressure control and cardiac health.;Identified with patient nutritional and/or medication changes necessary with exercise.   Monitoring  Purpose and frequency of SMBG.;Taught/evaluated SMBG meter.;Yearly dilated eye exam;Daily foot exams   Chronic complications Relationship between chronic complications and blood glucose control;Dental care;Reviewed with patient heart disease, higher risk of, and prevention;Retinopathy and reason for yearly dilated eye exams     Individualized Goals  (developed by patient)   Nutrition Follow meal plan discussed;Adjust meds/carbs with exercise as discussed   Physical Activity Exercise 1-2 times per week;15 minutes per day   Monitoring  test my blood glucose as discussed;test blood glucose pre and post meals as discussed     Post-Education Assessment   Patient understands the diabetes disease and treatment process. Demonstrates understanding / competency   Patient understands incorporating nutritional management into lifestyle. Demonstrates understanding / competency   Patient undertands incorporating physical activity into lifestyle. Demonstrates understanding / competency   Patient understands using medications safely. Demonstrates understanding / competency   Patient understands monitoring blood glucose, interpreting and using results Demonstrates understanding / competency   Patient understands prevention, detection, and treatment of acute complications. Demonstrates understanding / competency   Patient understands prevention, detection, and treatment of chronic complications. Demonstrates understanding / competency   Patient understands how to develop strategies to address psychosocial issues. Demonstrates understanding / competency   Patient understands how to develop strategies to promote health/change behavior. Demonstrates understanding / competency     Outcomes   Expected Outcomes Demonstrated interest in learning. Expect positive outcomes   Future DMSE PRN   Program Status Completed      Individualized Plan for Diabetes Self-Management Training:   Learning Objective:  Patient will have a greater understanding of diabetes self-management. Patient education plan is to attend individual and/or group sessions per assessed needs and concerns.   Plan:   Patient Instructions  -Always bring your meter with you everywhere you go -Always Properly dispose of your needles:  -Discard in a hard plastic/metal container with a lid  (something the needle can't puncture)  -Write Do Not Recycle on the outside of the container  -Example: A laundry detergent bottle -Never use the same needle more than once -Eat 2-3 carbohydrate choices for each meal and 1 for each snack -A meal: carbohydrates, protein, vegetable -A snack: A Fruit OR Vegetable AND Protein  -Try to be more active -Always pay attention to your body keeping watchful of possible low blood sugar (below 70) or high blood sugar (above 200) -Check your feet every single day -Try to check your blood sugar 4 times a week: sometimes before breakfast and sometimes 2 hours after you have eaten   Expected Outcomes:  Demonstrated interest in learning. Expect positive outcomes  Education material provided: Living Well with Diabetes, Meal plan card, My Plate and Snack sheet  If problems or questions, patient to contact team via:  Phone  Future DSME appointment: PRN

## 2015-12-16 ENCOUNTER — Ambulatory Visit: Payer: BC Managed Care – PPO | Admitting: Physician Assistant

## 2016-01-10 ENCOUNTER — Ambulatory Visit: Payer: Managed Care, Other (non HMO) | Admitting: Physician Assistant

## 2016-01-24 ENCOUNTER — Encounter: Payer: Self-pay | Admitting: Physician Assistant

## 2016-01-24 ENCOUNTER — Ambulatory Visit (INDEPENDENT_AMBULATORY_CARE_PROVIDER_SITE_OTHER): Payer: Managed Care, Other (non HMO) | Admitting: Physician Assistant

## 2016-01-24 VITALS — BP 134/84 | HR 104 | Ht 65.0 in | Wt 186.0 lb

## 2016-01-24 DIAGNOSIS — E7800 Pure hypercholesterolemia, unspecified: Secondary | ICD-10-CM

## 2016-01-24 DIAGNOSIS — E119 Type 2 diabetes mellitus without complications: Secondary | ICD-10-CM | POA: Diagnosis not present

## 2016-01-24 LAB — POCT GLYCOSYLATED HEMOGLOBIN (HGB A1C): Hemoglobin A1C: 6.9

## 2016-01-24 MED ORDER — ROSUVASTATIN CALCIUM 20 MG PO TABS: 20.0000 mg | ORAL_TABLET | Freq: Every day | ORAL | 1 refills | Status: DC

## 2016-01-24 MED ORDER — PIOGLITAZONE HCL 15 MG PO TABS: 15.0000 mg | ORAL_TABLET | Freq: Every day | ORAL | 1 refills | Status: DC

## 2016-01-24 MED ORDER — DAPAGLIFLOZIN PROPANEDIOL 10 MG PO TABS: ORAL_TABLET | ORAL | 1 refills | Status: DC

## 2016-01-24 MED ORDER — LISINOPRIL 10 MG PO TABS: ORAL_TABLET | ORAL | 1 refills | Status: DC

## 2016-01-24 NOTE — Progress Notes (Signed)
Subjective:    Patient ID: Tammy Bray, female    DOB: 02-22-1961, 55 y.o.   MRN: [id number]  HPI Pt is a 55 yo female who presents to the clinic for 3 month follow up on diabetes. She is taking faraxiga and actos daily. She doesn't check sugars. She is trying to keep to low sugar/carb diet. She is walking once or twice a week. She has no hypoglycemic events to report. No sores or wounds unhealed.    Review of Systems  All other systems reviewed and are negative.      Objective:   Physical Exam  Constitutional: She is oriented to person, place, and time. She appears well-developed and well-nourished.  HENT:  Head: Normocephalic and atraumatic.  Cardiovascular: Normal rate, regular rhythm and normal heart sounds.   Pulmonary/Chest: Effort normal and breath sounds normal.  Neurological: She is alert and oriented to person, place, and time.  Psychiatric: She has a normal mood and affect. Her behavior is normal.          Assessment & Plan:  [name] KitchenMarland KitchenTynishia was seen today for diabetes.  Diagnoses and all orders for this visit:  Controlled type 2 diabetes mellitus without complication, without long-term current use of insulin (HCC) -     POCT HgB A1C -     pioglitazone (ACTOS) 15 MG tablet; Take 1 tablet (15 mg total) by mouth daily. -     dapagliflozin propanediol (FARXIGA) 10 MG TABS tablet; TAKE 10 MG BY MOUTH DAILY. -     lisinopril (PRINIVIL,ZESTRIL) 10 MG tablet; TAKE 1 TABLET (10 MG TOTAL) BY MOUTH DAILY. -     rosuvastatin (CRESTOR) 20 MG tablet; Take 1 tablet (20 mg total) by mouth daily.  Pure hypercholesterolemia -     rosuvastatin (CRESTOR) 20 MG tablet; Take 1 tablet (20 mg total) by mouth daily.   .. Lab Results  Component Value Date   HGBA1C 6.9 01/24/2016   6.9 stable from 3 months ago.  Continue on current medications.  On STATIN, ON ACE.  Pt declined pneuomnia shot today.  Encouraged to get eye exam.  Follow up in 6 months and will get labs.

## 2016-02-05 ENCOUNTER — Ambulatory Visit (INDEPENDENT_AMBULATORY_CARE_PROVIDER_SITE_OTHER): Payer: Managed Care, Other (non HMO) | Admitting: Family Medicine

## 2016-02-05 DIAGNOSIS — R1084 Generalized abdominal pain: Secondary | ICD-10-CM | POA: Diagnosis not present

## 2016-02-05 DIAGNOSIS — R109 Unspecified abdominal pain: Secondary | ICD-10-CM | POA: Insufficient documentation

## 2016-02-05 LAB — CBC
HCT: 39.8 % (ref 35.0–45.0)
Hemoglobin: 12.8 g/dL (ref 11.7–15.5)
MCH: 25.7 pg — ABNORMAL LOW (ref 27.0–33.0)
MCHC: 32.2 g/dL (ref 32.0–36.0)
MCV: 79.9 fL — ABNORMAL LOW (ref 80.0–100.0)
MPV: 8.9 fL (ref 7.5–12.5)
Platelets: 270 10*3/uL (ref 140–400)
RBC: 4.98 MIL/uL (ref 3.80–5.10)
RDW: 14.4 % (ref 11.0–15.0)
WBC: 4.4 10*3/uL (ref 3.8–10.8)

## 2016-02-05 MED ORDER — OMEPRAZOLE 40 MG PO CPDR: 40.0000 mg | DELAYED_RELEASE_CAPSULE | Freq: Every day | ORAL | 3 refills | Status: DC

## 2016-02-05 NOTE — Patient Instructions (Signed)
Thank you for coming in today. Take omeprazole daily.  Get labs today.  Follow up with Jade in a month or sooner if needed.  If your belly pain worsens, or you have high fever, bad vomiting, blood in your stool or black tarry stool go to the Emergency Room.    Abdominal Pain, Adult Many things can cause belly (abdominal) pain. Most times, belly pain is not dangerous. Many cases of belly pain can be watched and treated at home. Sometimes belly pain is serious, though. Your doctor will try to find the cause of your belly pain. Follow these instructions at home:  Take over-the-counter and prescription medicines only as told by your doctor. Do not take medicines that help you poop (laxatives) unless told to by your doctor.  Drink enough fluid to keep your pee (urine) clear or pale yellow.  Watch your belly pain for any changes.  Keep all follow-up visits as told by your doctor. This is important. Contact a doctor if:  Your belly pain changes or gets worse.  You are not hungry, or you lose weight without trying.  You are having trouble pooping (constipated) or have watery poop (diarrhea) for more than 2-3 days.  You have pain when you pee or poop.  Your belly pain wakes you up at night.  Your pain gets worse with meals, after eating, or with certain foods.  You are throwing up and cannot keep anything down.  You have a fever. Get help right away if:  Your pain does not go away as soon as your doctor says it should.  You cannot stop throwing up.  Your pain is only in areas of your belly, such as the right side or the left lower part of the belly.  You have bloody or black poop, or poop that looks like tar.  You have very bad pain, cramping, or bloating in your belly.  You have signs of not having enough fluid or water in your body (dehydration), such as:  Dark pee, very little pee, or no pee.  Cracked lips.  Dry mouth.  Sunken eyes.  Sleepiness.  Weakness. This  information is not intended to replace advice given to you by your health care provider. Make sure you discuss any questions you have with your health care provider. Document Released: 08/19/2007 Document Revised: 09/20/2015 Document Reviewed: 08/14/2015 Elsevier Interactive Patient Education  2017 [employer]

## 2016-02-05 NOTE — Progress Notes (Signed)
       Tammy Bray is a 55 y.o. female who [facility] today for Domino pain. Patient has a week of mild intermittent crampy diffuse abdominal pains. This is associated with bloating and gas. She notes normal bowel movements and denies any vomiting diarrhea or blood in the stool. She has not tried any medications yet. She feels well otherwise. She denies significant urinary symptoms. She's not had a menstrual period for about 10 years and denies any current vaginal bleeding.   Past Medical History:  Diagnosis Date  . Diabetes mellitus without complication (HCC)   . Hypertension    No past surgical history on file. Social History  Substance Use Topics  . Smoking status: Never Smoker  . Smokeless tobacco: Not on file  . Alcohol use Yes   family history includes Diabetes in her father and mother; Heart attack in her father.  ROS as above:  Medications: Current Outpatient Prescriptions  Medication Sig Dispense Refill  . dapagliflozin propanediol (FARXIGA) 10 MG TABS tablet TAKE 10 MG BY MOUTH DAILY. 90 tablet 1  . lisinopril (PRINIVIL,ZESTRIL) 10 MG tablet TAKE 1 TABLET (10 MG TOTAL) BY MOUTH DAILY. 90 tablet 1  . meloxicam (MOBIC) 15 MG tablet Take 1 tablet (15 mg total) by mouth daily. Take with food each morning 15 tablet 0  . pioglitazone (ACTOS) 15 MG tablet Take 1 tablet (15 mg total) by mouth daily. 90 tablet 1  . rosuvastatin (CRESTOR) 20 MG tablet Take 1 tablet (20 mg total) by mouth daily. 90 tablet 1  . omeprazole (PRILOSEC) 40 MG capsule Take 1 capsule (40 mg total) by mouth daily. 30 capsule 3   No current facility-administered medications for this visit.    Allergies  Allergen Reactions  . Metformin And Related     Nausea/GI side effects    Health Maintenance Health Maintenance  Topic Date Due  . COLONOSCOPY  05/25/2010  .  OPHTHALMOLOGY EXAM  06/30/2015  . PNEUMOCOCCAL POLYSACCHARIDE VACCINE (1) 04/25/2016 (Originally 05/25/1962)  . PAP SMEAR  07/17/2016  . HEMOGLOBIN A1C  07/23/2016  . FOOT EXAM  09/22/2016  . MAMMOGRAM  09/22/2016  . TETANUS/TDAP  03/16/2021  . INFLUENZA VACCINE  Completed  . Hepatitis C Screening  Completed  . HIV Screening  Completed     Exam:  BP 127/87   Pulse 81   Wt 189 lb (85.7 kg)   BMI 31.45 kg/m  Gen: Well NAD Nontoxic appearing HEENT: EOMI,  MMM Lungs: Normal work of breathing. CTABL Heart: RRR no MRG Abd: NABS, Soft. Nondistended, Nontender Exts: Brisk capillary refill, warm and well perfused.      Assessment and Plan: 55 y.o. female with intermittent cramping abdominal pain. Unclear etiology. Plan for limited lab work as below along with trial of omeprazole. Follow-up with PCP in about a month or sooner if needed.   Orders Placed This Encounter  Procedures  . CBC  . COMPLETE METABOLIC PANEL WITH GFR  . Lipase    Discussed warning signs or symptoms. Please see discharge instructions. Patient expresses understanding.

## 2016-02-06 LAB — COMPLETE METABOLIC PANEL WITH GFR
ALT: 12 U/L (ref 6–29)
AST: 15 U/L (ref 10–35)
Albumin: 4.4 g/dL (ref 3.6–5.1)
Alkaline Phosphatase: 61 U/L (ref 33–130)
BUN: 13 mg/dL (ref 7–25)
CO2: 28 mmol/L (ref 20–31)
Calcium: 9.6 mg/dL (ref 8.6–10.4)
Chloride: 108 mmol/L (ref 98–110)
Creat: 1.17 mg/dL — ABNORMAL HIGH (ref 0.50–1.05)
GFR, Est African American: 61 mL/min (ref >=60)
GFR, Est Non African American: 53 mL/min — ABNORMAL LOW (ref >=60)
Glucose, Bld: 95 mg/dL (ref 65–99)
Potassium: 4.2 mmol/L (ref 3.5–5.3)
Sodium: 144 mmol/L (ref 135–146)
Total Bilirubin: 0.5 mg/dL (ref 0.2–1.2)
Total Protein: 7.4 g/dL (ref 6.1–8.1)

## 2016-02-06 LAB — LIPASE: Lipase: 15 U/L (ref 7–60)

## 2016-05-12 ENCOUNTER — Other Ambulatory Visit: Payer: Self-pay | Admitting: Physician Assistant

## 2016-05-12 DIAGNOSIS — E119 Type 2 diabetes mellitus without complications: Secondary | ICD-10-CM

## 2016-05-12 DIAGNOSIS — E7800 Pure hypercholesterolemia, unspecified: Secondary | ICD-10-CM

## 2016-05-12 LAB — HM DIABETES EYE EXAM

## 2016-07-24 ENCOUNTER — Ambulatory Visit: Admitting: Physician Assistant

## 2016-07-29 ENCOUNTER — Ambulatory Visit: Payer: BC Managed Care – PPO | Admitting: Physician Assistant

## 2016-07-29 ENCOUNTER — Ambulatory Visit: Admitting: Physician Assistant

## 2016-07-31 ENCOUNTER — Ambulatory Visit: Admitting: Physician Assistant

## 2016-08-03 ENCOUNTER — Ambulatory Visit: Admitting: Physician Assistant

## 2016-08-26 ENCOUNTER — Telehealth: Payer: Self-pay | Admitting: Physician Assistant

## 2016-08-26 ENCOUNTER — Ambulatory Visit (INDEPENDENT_AMBULATORY_CARE_PROVIDER_SITE_OTHER): Payer: Managed Care, Other (non HMO) | Admitting: Physician Assistant

## 2016-08-26 ENCOUNTER — Encounter: Payer: Self-pay | Admitting: Physician Assistant

## 2016-08-26 VITALS — BP 115/73 | HR 81 | Ht 65.0 in | Wt 189.0 lb

## 2016-08-26 DIAGNOSIS — I10 Essential (primary) hypertension: Secondary | ICD-10-CM

## 2016-08-26 DIAGNOSIS — E119 Type 2 diabetes mellitus without complications: Secondary | ICD-10-CM | POA: Diagnosis not present

## 2016-08-26 DIAGNOSIS — Z23 Encounter for immunization: Secondary | ICD-10-CM | POA: Diagnosis not present

## 2016-08-26 LAB — POCT GLYCOSYLATED HEMOGLOBIN (HGB A1C): Hemoglobin A1C: 7.1

## 2016-08-26 MED ORDER — DAPAGLIFLOZIN PROPANEDIOL 10 MG PO TABS: ORAL_TABLET | ORAL | 0 refills | Status: DC

## 2016-08-26 MED ORDER — LISINOPRIL 10 MG PO TABS: ORAL_TABLET | ORAL | 1 refills | Status: DC

## 2016-08-26 MED ORDER — PIOGLITAZONE HCL 15 MG PO TABS: 15.0000 mg | ORAL_TABLET | Freq: Every day | ORAL | 0 refills | Status: DC

## 2016-08-26 NOTE — Progress Notes (Signed)
Subjective:    Patient ID: Tammy Bray, female    DOB: 1960/08/16, 56 y.o.   MRN: [id number]  HPI  Pt is a 56 yo female who presents to the clinic for DM 3 month follow up.   She is not checking glucose. She is taking actos and faraxiga. She is having no side effects. She denies any hypoglycemia. No open sores or wounds. She had eye exam in febuary at my eye [facility]. She is not exercising. She admits to eating more fruit over the past 3 months and not watching her diet.   HTN- controlled on lisinopril. No CP, palpitations, headaches, or vision changes.   .. Active Ambulatory Problems    Diagnosis Date Noted  . Diabetes mellitus, type 2 (HCC) 01/13/2013  . Essential hypertension, benign 01/13/2013  . Hyperlipidemia 01/13/2013  . Right knee pain 01/13/2013  . IT band syndrome 01/13/2013  . Right hip pain 01/13/2013  . Tachycardia 11/25/2014  . Uncontrolled type 2 diabetes mellitus with complication, without long-term current use of insulin (HCC) 06/14/2015  . Abdominal pain 02/05/2016  . Controlled type 2 diabetes mellitus without complication, without long-term current use of insulin (HCC) 08/26/2016   Resolved Ambulatory Problems    Diagnosis Date Noted  . No Resolved Ambulatory Problems   Past Medical History:  Diagnosis Date  . Diabetes mellitus without complication (HCC)   . Hypertension       Review of Systems  All other systems reviewed and are negative.      Objective:   Physical Exam  Constitutional: She is oriented to person, place, and time. She appears well-developed and well-nourished.  HENT:  Head: Normocephalic and atraumatic.  Cardiovascular: Normal rate, regular rhythm and normal heart sounds.   Pulmonary/Chest: Effort normal and breath sounds normal.  Neurological: She is alert and oriented to person, place, and time.  Psychiatric: She has a normal mood and affect. Her behavior is normal.          Assessment & Plan:   Tammy KitchenMarland KitchenFinna was seen  today for diabetes and hypertension.  Diagnoses and all orders for this visit:  Controlled type 2 diabetes mellitus without complication, without long-term current use of insulin (HCC) -     POCT HgB A1C -     Pneumococcal polysaccharide vaccine 23-valent greater than or equal to 2yo subcutaneous/IM -     dapagliflozin propanediol (FARXIGA) 10 MG TABS tablet; TAKE 10 MG BY MOUTH DAILY. -     pioglitazone (ACTOS) 15 MG tablet; Take 1 tablet (15 mg total) by mouth daily. -     lisinopril (PRINIVIL,ZESTRIL) 10 MG tablet; TAKE 1 TABLET (10 MG TOTAL) BY MOUTH DAILY.  Need for pneumococcal vaccination -     Pneumococcal polysaccharide vaccine 23-valent greater than or equal to 2yo subcutaneous/IM  Essential hypertension, benign     .Tammy Kitchen Lab Results  Component Value Date   HGBA1C 7.1 08/26/2016   A1C up a little bit. Make sure not eating too much fruit.  Stay on same medications.  pneumoccocal 23 given today.  Will call to get annual eye exam.  On statin.  On ACE.  BP controlled.  Follow up in 3 months.

## 2016-08-26 NOTE — Telephone Encounter (Signed)
Please call and get eye annual eye exam from [facility] per patient done in feb. This year.

## 2016-08-26 NOTE — Telephone Encounter (Signed)
Request placed in Tammy Bray's basket to fax.

## 2016-09-29 IMAGING — DX DG HAND COMPLETE 3+V*L*
3 series · 3 of 3 positions shown · non-contrast
Comparison: None.

CLINICAL DATA: Left hand pain for several weeks, no known injury,
initial encounter

EXAM:
LEFT HAND - COMPLETE 3+ VIEW

[hand pa]
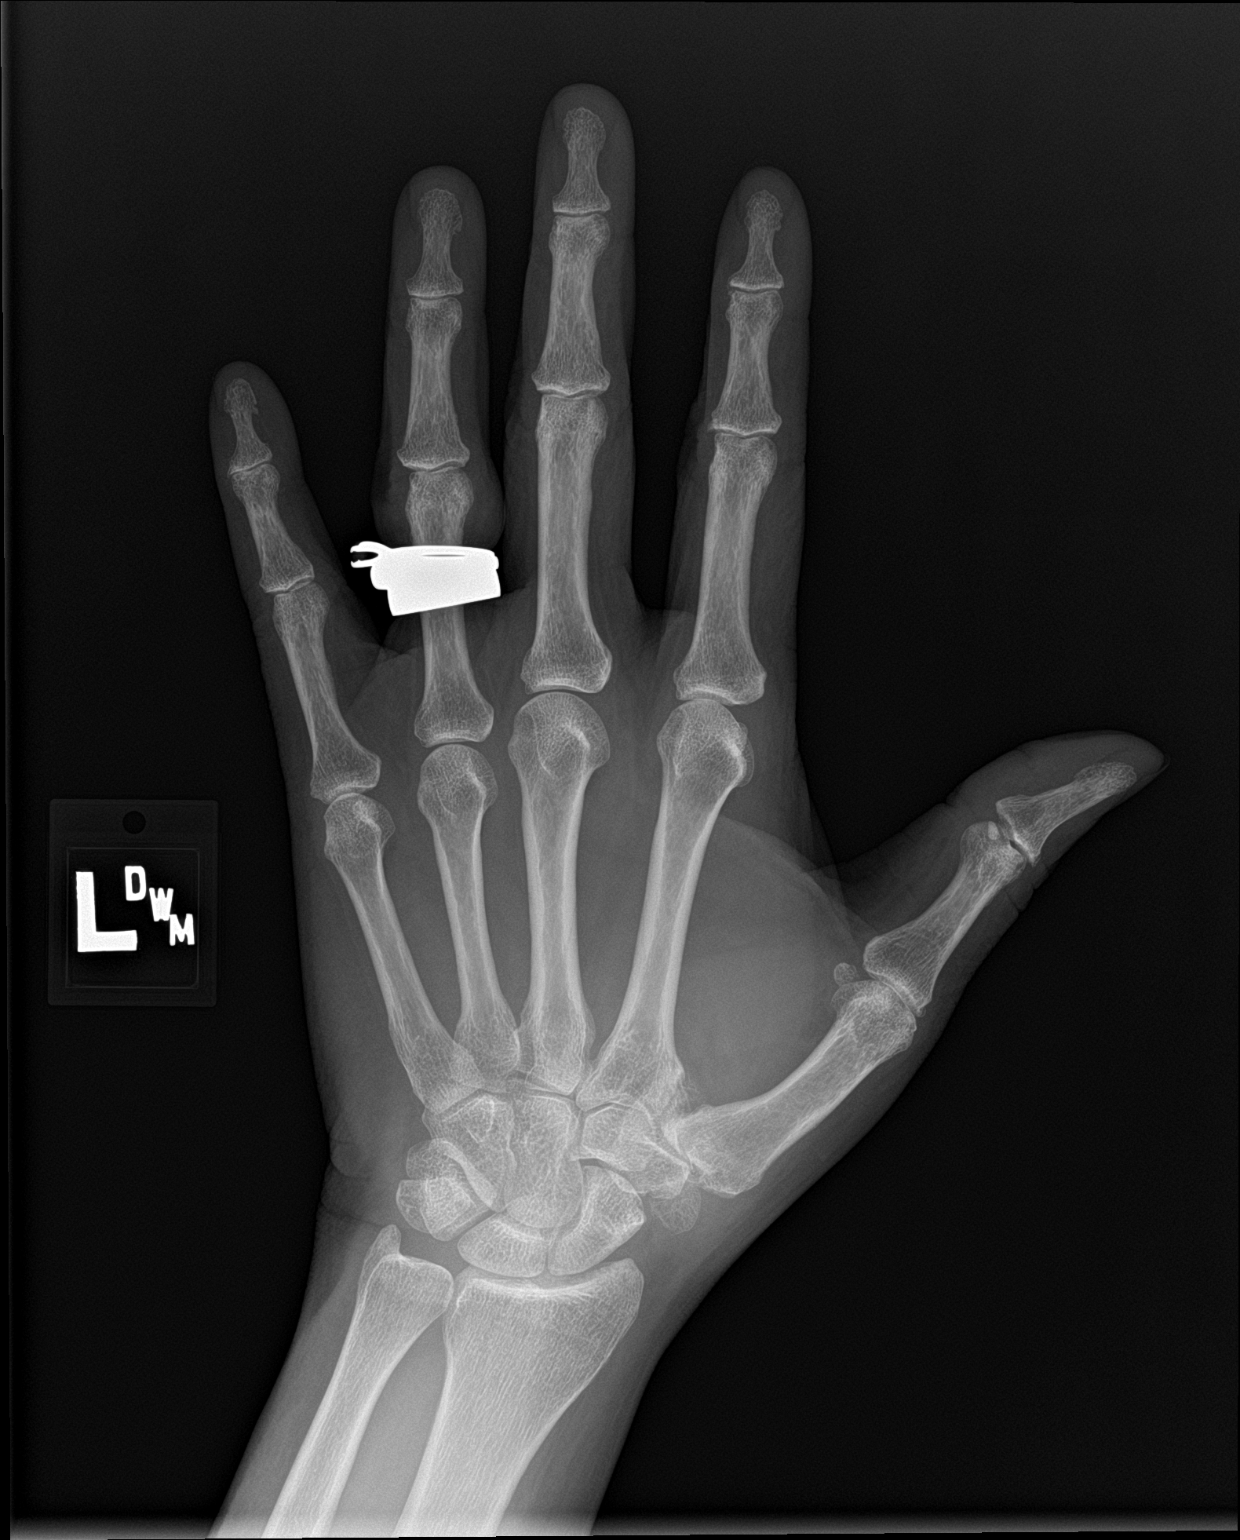

[hand obl]
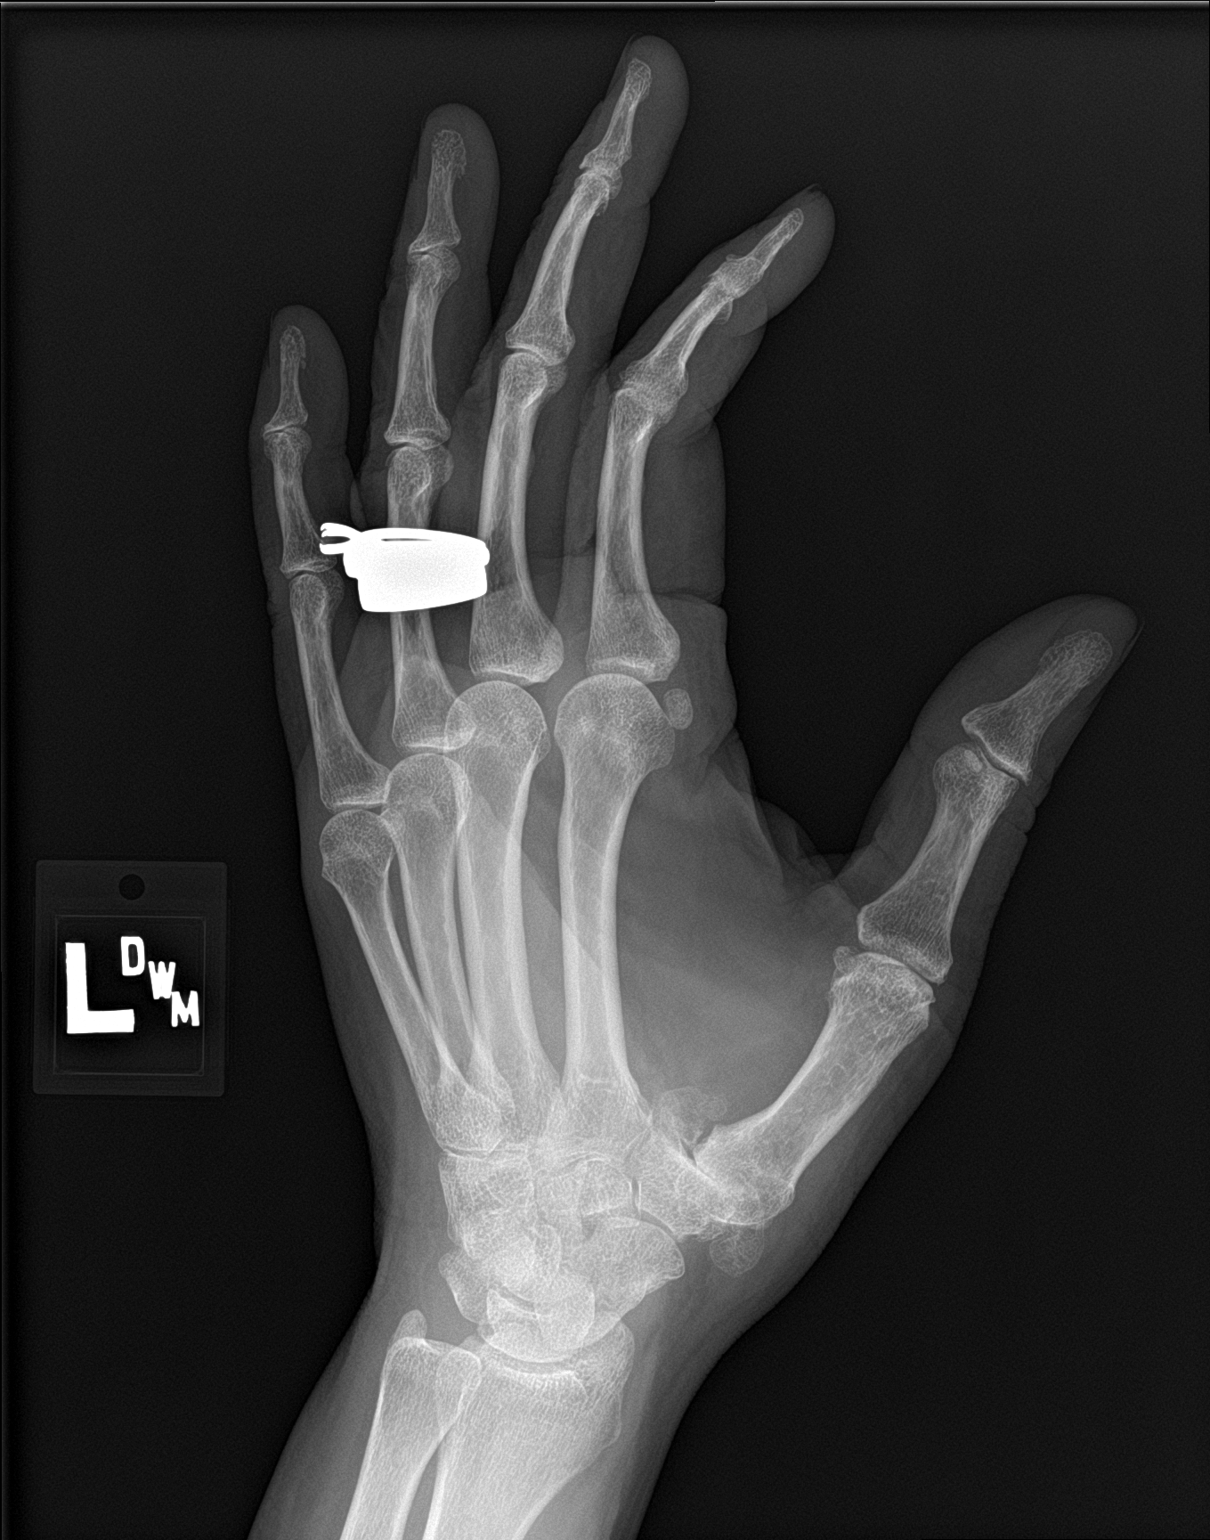

[hand lat]
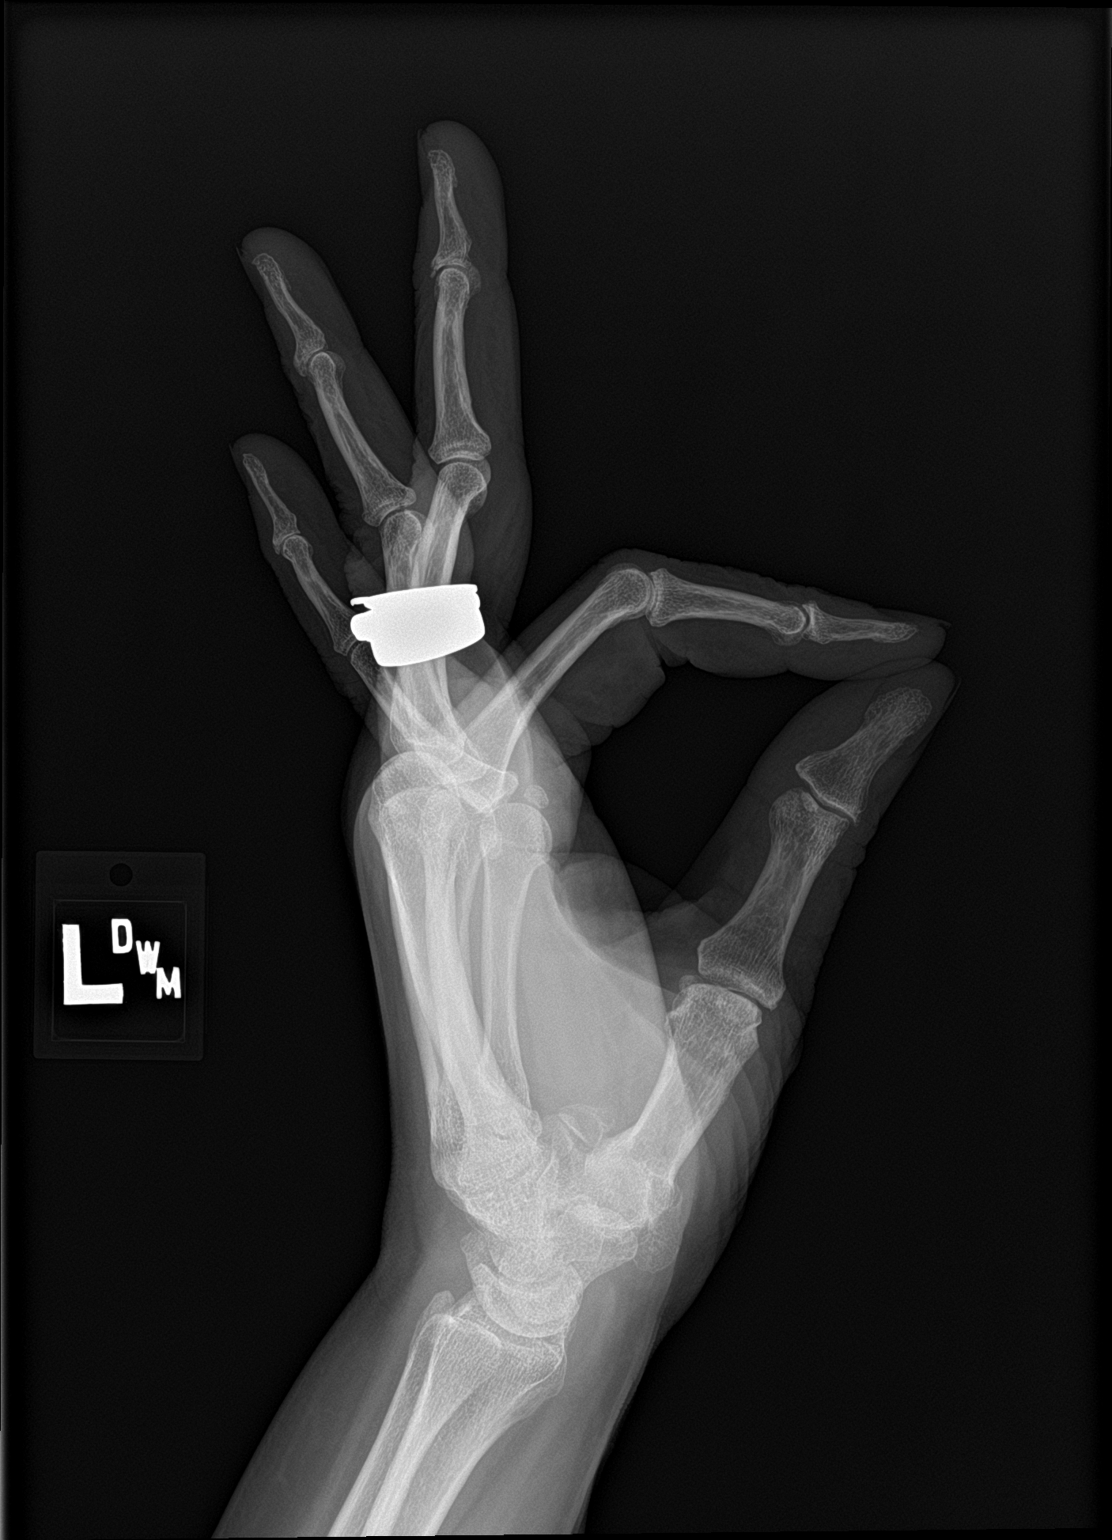

[3 of 3 positions shown; findings below may reference images not displayed]

FINDINGS: Degenerative changes are noted at the first CMC joint. Extra bony
densities are noted adjacent to the joint space which may represent
sequela from prior trauma. No acute fracture is seen. No gross soft
tissue abnormality is noted.
IMPRESSION: Degenerative change without acute abnormality.

## 2016-11-18 ENCOUNTER — Encounter: Payer: Self-pay | Admitting: Physician Assistant

## 2016-11-27 ENCOUNTER — Ambulatory Visit: Payer: Managed Care, Other (non HMO) | Admitting: Physician Assistant

## 2017-01-15 ENCOUNTER — Other Ambulatory Visit (HOSPITAL_COMMUNITY)
Admission: RE | Admit: 2017-01-15 | Discharge: 2017-01-15 | Disposition: A | Discharge status: Home / Self Care | Payer: Managed Care, Other (non HMO) | Source: Ambulatory Visit | Attending: Physician Assistant | Admitting: Physician Assistant

## 2017-01-15 ENCOUNTER — Ambulatory Visit (INDEPENDENT_AMBULATORY_CARE_PROVIDER_SITE_OTHER): Payer: Managed Care, Other (non HMO) | Admitting: Physician Assistant

## 2017-01-15 ENCOUNTER — Encounter: Payer: Self-pay | Admitting: Physician Assistant

## 2017-01-15 VITALS — BP 125/80 | HR 99 | Ht 65.0 in | Wt 190.0 lb

## 2017-01-15 DIAGNOSIS — Z01419 Encounter for gynecological examination (general) (routine) without abnormal findings: Secondary | ICD-10-CM

## 2017-01-15 DIAGNOSIS — E7800 Pure hypercholesterolemia, unspecified: Secondary | ICD-10-CM

## 2017-01-15 DIAGNOSIS — Z0000 Encounter for general adult medical examination without abnormal findings: Secondary | ICD-10-CM

## 2017-01-15 DIAGNOSIS — R319 Hematuria, unspecified: Secondary | ICD-10-CM

## 2017-01-15 DIAGNOSIS — E118 Type 2 diabetes mellitus with unspecified complications: Secondary | ICD-10-CM

## 2017-01-15 DIAGNOSIS — E1165 Type 2 diabetes mellitus with hyperglycemia: Secondary | ICD-10-CM

## 2017-01-15 DIAGNOSIS — Z13220 Encounter for screening for lipoid disorders: Secondary | ICD-10-CM

## 2017-01-15 DIAGNOSIS — Z1211 Encounter for screening for malignant neoplasm of colon: Secondary | ICD-10-CM | POA: Diagnosis not present

## 2017-01-15 DIAGNOSIS — IMO0002 Reserved for concepts with insufficient information to code with codable children: Secondary | ICD-10-CM

## 2017-01-15 DIAGNOSIS — E119 Type 2 diabetes mellitus without complications: Secondary | ICD-10-CM

## 2017-01-15 LAB — COMPLETE METABOLIC PANEL WITH GFR
AG Ratio: 1.4 (calc) (ref 1.0–2.5)
ALT: 12 U/L (ref 6–29)
AST: 14 U/L (ref 10–35)
Albumin: 4.4 g/dL (ref 3.6–5.1)
Alkaline phosphatase (APISO): 66 U/L (ref 33–130)
BUN: 13 mg/dL (ref 7–25)
CO2: 26 mmol/L (ref 20–32)
Calcium: 10 mg/dL (ref 8.6–10.4)
Chloride: 105 mmol/L (ref 98–110)
Creat: 1.03 mg/dL (ref 0.50–1.05)
GFR, Est African American: 70 mL/min/{1.73_m2} (ref >=60)
GFR, Est Non African American: 61 mL/min/{1.73_m2} (ref >=60)
Globulin: 3.1 g/dL (calc) (ref 1.9–3.7)
Glucose, Bld: 123 mg/dL — ABNORMAL HIGH (ref 65–99)
Potassium: 4.1 mmol/L (ref 3.5–5.3)
Sodium: 141 mmol/L (ref 135–146)
Total Bilirubin: 0.6 mg/dL (ref 0.2–1.2)
Total Protein: 7.5 g/dL (ref 6.1–8.1)

## 2017-01-15 LAB — POCT URINALYSIS DIPSTICK
Bilirubin, UA: NEGATIVE
Glucose, UA: 500
Ketones, UA: NEGATIVE
Leukocytes, UA: NEGATIVE
Nitrite, UA: NEGATIVE
Protein, UA: NEGATIVE
Spec Grav, UA: 1.01 (ref 1.010–1.025)
Urobilinogen, UA: 0.2 E.U./dL
pH, UA: 6 (ref 5.0–8.0)

## 2017-01-15 LAB — LIPID PANEL W/REFLEX DIRECT LDL
Cholesterol: 178 mg/dL (ref <200)
HDL: 79 mg/dL (ref >50)
LDL Cholesterol (Calc): 84 mg/dL (calc)
Non-HDL Cholesterol (Calc): 99 mg/dL (calc) (ref <130)
Total CHOL/HDL Ratio: 2.3 (calc) (ref <5.0)
Triglycerides: 73 mg/dL (ref <150)

## 2017-01-15 LAB — POCT GLYCOSYLATED HEMOGLOBIN (HGB A1C): Hemoglobin A1C: 7.1

## 2017-01-15 MED ORDER — PHENAZOPYRIDINE HCL 200 MG PO TABS: 200.0000 mg | ORAL_TABLET | Freq: Three times a day (TID) | ORAL | 0 refills | Status: AC

## 2017-01-15 NOTE — Progress Notes (Signed)
Subjective:    Patient ID: Tammy Bray, female    DOB: Mar 21, 1960, 56 y.o.   MRN: [id number]  HPI Patient is a pleasant 56 yo female who presents to the clinic for CPE/PAP and follow up.   .. Active Ambulatory Problems    Diagnosis Date Noted  . Diabetes mellitus, type 2 (HCC) 01/13/2013  . Essential hypertension, benign 01/13/2013  . Hyperlipidemia 01/13/2013  . Right knee pain 01/13/2013  . IT band syndrome 01/13/2013  . Right hip pain 01/13/2013  . Tachycardia 11/25/2014  . Uncontrolled type 2 diabetes mellitus with complication, without long-term current use of insulin (HCC) 06/14/2015  . Abdominal pain 02/05/2016  . Controlled type 2 diabetes mellitus without complication, without long-term current use of insulin (HCC) 08/26/2016  . Hematuria 01/15/2017   Resolved Ambulatory Problems    Diagnosis Date Noted  . No Resolved Ambulatory Problems   Past Medical History:  Diagnosis Date  . Diabetes mellitus without complication (HCC)   . Hypertension    .[name] Kitchen Family History  Problem Relation Age of Onset  . Diabetes Mother   . Heart attack Father   . Diabetes Father    .[name] Kitchen Social History   Socioeconomic History  . Marital status: Married    Spouse name: Not on file  . Number of children: Not on file  . Years of education: Not on file  . Highest education level: Not on file  Social Needs  . Financial resource strain: Not on file  . Food insecurity - worry: Not on file  . Food insecurity - inability: Not on file  . Transportation needs - medical: Not on file  . Transportation needs - non-medical: Not on file  Occupational History  . Not on file  Tobacco Use  . Smoking status: Never Smoker  . Smokeless tobacco: Never Used  Substance and Sexual Activity  . Alcohol use: Yes  . Drug use: No  . Sexual activity: Yes  Other Topics Concern  . Not on file  Social History Narrative  . Not on file   She is sexually active with no hx of abnormal pap smears. She is  post menopausal  She is checking fasting sugars and running in 120's/130's. Denies any hypoglycemia.  Recently had UTI and wanted to check for clearance. No dsyuria but noticed a bloody tint to urine.       Review of Systems  All other systems reviewed and are negative.      Objective:   Physical Exam  Constitutional: She is oriented to person, place, and time. She appears well-developed and well-nourished.  HENT:  Head: Normocephalic and atraumatic.  Right Ear: External ear normal.  Left Ear: External ear normal.  Nose: Nose normal.  Mouth/Throat: Oropharynx is clear and moist. No oropharyngeal exudate.  Bony overgrowth in the roof of upper palate.   Eyes: Conjunctivae and EOM are normal. Pupils are equal, round, and reactive to light.  Neck: Normal range of motion. Neck supple. No thyromegaly present.  Cardiovascular: Normal rate, regular rhythm and normal heart sounds.  Pulmonary/Chest: Effort normal and breath sounds normal. She has no wheezes.  Negative for CVA tenderness.   Abdominal: Soft. Bowel sounds are normal. She exhibits no distension and no mass. There is no tenderness. There is no rebound and no guarding.  Genitourinary: Vagina normal and uterus normal. Rectal exam shows guaiac negative stool. No vaginal discharge found.  Genitourinary Comments: Friable cervix.  No adenxal tenderness.   Musculoskeletal: Normal range of motion.  Lymphadenopathy:    She has no cervical adenopathy.  Neurological: She is alert and oriented to person, place, and time. She has normal reflexes. No cranial nerve deficit.  Skin: Skin is dry.  Psychiatric: She has a normal mood and affect. Her behavior is normal.          Assessment & Plan:  [name] KitchenMarland KitchenChauntae was seen today for gynecologic exam and hypertension.  Diagnoses and all orders for this visit:  Routine physical examination -     Lipid Panel w/reflex Direct LDL -     COMPLETE METABOLIC PANEL WITH GFR  Uncontrolled type 2  diabetes mellitus with complication, without long-term current use of insulin (HCC) -     POCT HgB A1C -     COMPLETE METABOLIC PANEL WITH GFR  Encounter for well woman exam with routine gynecological exam  Hematuria, unspecified type -     POCT urinalysis dipstick -     Cytology - PAP -     Urine Culture -     phenazopyridine (PYRIDIUM) 200 MG tablet; Take 1 tablet (200 mg total) by mouth 3 (three) times daily. -     COMPLETE METABOLIC PANEL WITH GFR  Screening for lipid disorders -     Lipid Panel w/reflex Direct LDL  Controlled type 2 diabetes mellitus without complication, without long-term current use of insulin (HCC) -     rosuvastatin (CRESTOR) 20 MG tablet; Take 1 tablet (20 mg total) daily by mouth.  Pure hypercholesterolemia -     rosuvastatin (CRESTOR) 20 MG tablet; Take 1 tablet (20 mg total) daily by mouth.    Lab Results  Component Value Date   HGBA1C 7.1 01/15/2017   Mammogram done at cancer center this year.  Colonoscopy ordered.  Pap done today. Declined STD testing.  shingrix HO given.  Fasting labs ordered.   UA dipstick positive for blood. Will culture. Recheck in 2 weeks. If culture positive will send in abx.   . Discussed 150 minutes of exercise a week.  Encouraged vitamin D 1000 units and Calcium 1300mg  or 4 servings of dairy a day.   A!C improved. No changes made with medication today. Follow up in 3 months. Continue to work on diabetic diet and weight loss.

## 2017-01-15 NOTE — Patient Instructions (Signed)

## 2017-01-16 LAB — URINE CULTURE
MICRO NUMBER:: 81233205
Result:: NO GROWTH
SPECIMEN QUALITY:: ADEQUATE

## 2017-01-17 ENCOUNTER — Encounter: Payer: Self-pay | Admitting: Physician Assistant

## 2017-01-17 MED ORDER — ROSUVASTATIN CALCIUM 20 MG PO TABS: 20.0000 mg | ORAL_TABLET | Freq: Every day | ORAL | 3 refills | Status: DC

## 2017-01-17 NOTE — Addendum Note (Signed)
Addended by: Jomarie LongsBREEBACK, Kelena Garrow L on: 01/17/2017 11:18 PM   Modules accepted: Orders

## 2017-01-19 LAB — CYTOLOGY - PAP
Diagnosis: NEGATIVE
HPV: NOT DETECTED

## 2017-01-29 ENCOUNTER — Ambulatory Visit (INDEPENDENT_AMBULATORY_CARE_PROVIDER_SITE_OTHER): Payer: Managed Care, Other (non HMO) | Admitting: Physician Assistant

## 2017-01-29 ENCOUNTER — Encounter: Payer: Self-pay | Admitting: Physician Assistant

## 2017-01-29 VITALS — BP 133/74 | HR 82 | Ht 65.0 in | Wt 189.0 lb

## 2017-01-29 DIAGNOSIS — R319 Hematuria, unspecified: Secondary | ICD-10-CM | POA: Diagnosis not present

## 2017-01-29 DIAGNOSIS — E119 Type 2 diabetes mellitus without complications: Secondary | ICD-10-CM | POA: Diagnosis not present

## 2017-01-29 DIAGNOSIS — I10 Essential (primary) hypertension: Secondary | ICD-10-CM

## 2017-01-29 LAB — POCT URINALYSIS DIPSTICK
Bilirubin, UA: NEGATIVE
Ketones, UA: NEGATIVE
Nitrite, UA: NEGATIVE
Protein, UA: NEGATIVE
Spec Grav, UA: 1.01 (ref 1.010–1.025)
Urobilinogen, UA: 0.2 E.U./dL
pH, UA: 6 (ref 5.0–8.0)

## 2017-01-29 NOTE — Progress Notes (Signed)
Subjective:    Patient ID: Tammy Bray, female    DOB: 27-Jul-1960, 56 y.o.   MRN: [id number]  HPI  Pt is a 56 yo female with DM and HTN who presents to the clinic for follow up on hematuria. She recently had confirmed UTI and treated with abx. At CPE we rechecked urine and blood remained in urine. Hematuria remained but confirmed no infection with culture. She was given pyridium for bladder inflammation. She is completely asymptomatic. No cramping, pain, discharge, dysuria, itching.   .. Active Ambulatory Problems    Diagnosis Date Noted  . Diabetes mellitus, type 2 (HCC) 01/13/2013  . Essential hypertension, benign 01/13/2013  . Hyperlipidemia 01/13/2013  . Right knee pain 01/13/2013  . IT band syndrome 01/13/2013  . Right hip pain 01/13/2013  . Tachycardia 11/25/2014  . Uncontrolled type 2 diabetes mellitus with complication, without long-term current use of insulin (HCC) 06/14/2015  . Abdominal pain 02/05/2016  . Controlled type 2 diabetes mellitus without complication, without long-term current use of insulin (HCC) 08/26/2016  . Hematuria 01/15/2017   Resolved Ambulatory Problems    Diagnosis Date Noted  . No Resolved Ambulatory Problems   Past Medical History:  Diagnosis Date  . Diabetes mellitus without complication (HCC)   . Hypertension        Review of Systems  All other systems reviewed and are negative.      Objective:   Physical Exam No CVA tenderness.  No abdominal tenderness, guarding, rebound, masses.        Assessment & Plan:  Tammy KitchenMarland KitchenTheda was seen today for follow-up.  Diagnoses and all orders for this visit:  Hematuria, unspecified type -     Ambulatory referral to Urology -     POCT urinalysis dipstick -     Urinalysis, microscopic only  Essential hypertension, benign  Controlled type 2 diabetes mellitus without complication, without long-term current use of insulin (HCC)   .Tammy Bray Results for orders placed or performed in visit on  01/29/17  POCT urinalysis dipstick  Result Value Ref Range   Color, UA Yellow    Clarity, UA Clear    Glucose, UA 500mg /dl    Bilirubin, UA negative    Ketones, UA negative    Spec Grav, UA 1.010 1.010 - 1.025   Blood, UA moderate    pH, UA 6.0 5.0 - 8.0   Protein, UA negative    Urobilinogen, UA 0.2 0.2 or 1.0 E.U./dL   Nitrite, UA negatie    Leukocytes, UA Trace (A) Negative   Ordered mircoscopic.  Will send to urology for further work up.

## 2017-01-29 NOTE — Patient Instructions (Signed)

## 2017-01-30 LAB — URINALYSIS, MICROSCOPIC ONLY
Bacteria, UA: NONE SEEN /HPF
Hyaline Cast: NONE SEEN /LPF

## 2017-02-07 ENCOUNTER — Other Ambulatory Visit: Payer: Self-pay | Admitting: Physician Assistant

## 2017-02-07 DIAGNOSIS — E119 Type 2 diabetes mellitus without complications: Secondary | ICD-10-CM

## 2017-03-20 ENCOUNTER — Other Ambulatory Visit: Payer: Self-pay | Admitting: Physician Assistant

## 2017-03-20 DIAGNOSIS — E119 Type 2 diabetes mellitus without complications: Secondary | ICD-10-CM

## 2017-03-30 ENCOUNTER — Other Ambulatory Visit: Payer: Self-pay | Admitting: *Deleted

## 2017-03-30 ENCOUNTER — Telehealth: Payer: Self-pay | Admitting: *Deleted

## 2017-03-30 DIAGNOSIS — E119 Type 2 diabetes mellitus without complications: Secondary | ICD-10-CM

## 2017-03-30 MED ORDER — DAPAGLIFLOZIN PROPANEDIOL 10 MG PO TABS: 10.0000 mg | ORAL_TABLET | Freq: Every day | ORAL | 0 refills | Status: DC

## 2017-03-30 NOTE — Telephone Encounter (Signed)
copay bypass initiated through [employer] for farxiga

## 2017-04-05 ENCOUNTER — Telehealth: Payer: Self-pay | Admitting: Physician Assistant

## 2017-04-05 NOTE — Telephone Encounter (Signed)
Received fax form for farxiga filled out put in providers box for signature will send when complete - CF

## 2017-04-22 ENCOUNTER — Telehealth: Payer: Self-pay | Admitting: *Deleted

## 2017-04-22 NOTE — Telephone Encounter (Signed)
Pt left vm stating that she has been out of her Marcelline DeistFarxiga for about a month now and wants to know if there is something similar that you can send to the pharmacy while we wait for her insurance company to get back to us on the PA for [place]xiga. Please advise.

## 2017-04-23 NOTE — Telephone Encounter (Signed)
Likely every medication in that class will need a PA. Lets hold off and wait for approval at this time. If not approved will have to consider other medications.

## 2017-04-28 NOTE — Telephone Encounter (Signed)
Finally got [place]xiga approved today.  The approval date will be backdated due to ins co losing the claim and closing PA request out.  [facility]

## 2017-08-29 ENCOUNTER — Other Ambulatory Visit: Payer: Self-pay | Admitting: Physician Assistant

## 2017-08-29 DIAGNOSIS — E7800 Pure hypercholesterolemia, unspecified: Secondary | ICD-10-CM

## 2017-08-29 DIAGNOSIS — E119 Type 2 diabetes mellitus without complications: Secondary | ICD-10-CM

## 2017-09-08 ENCOUNTER — Encounter: Payer: Self-pay | Admitting: Physician Assistant

## 2017-09-08 ENCOUNTER — Ambulatory Visit (INDEPENDENT_AMBULATORY_CARE_PROVIDER_SITE_OTHER): Payer: Managed Care, Other (non HMO) | Admitting: Physician Assistant

## 2017-09-08 VITALS — BP 128/84 | HR 94 | Wt 191.0 lb

## 2017-09-08 DIAGNOSIS — E782 Mixed hyperlipidemia: Secondary | ICD-10-CM | POA: Diagnosis not present

## 2017-09-08 DIAGNOSIS — E1165 Type 2 diabetes mellitus with hyperglycemia: Secondary | ICD-10-CM | POA: Diagnosis not present

## 2017-09-08 DIAGNOSIS — E118 Type 2 diabetes mellitus with unspecified complications: Secondary | ICD-10-CM

## 2017-09-08 DIAGNOSIS — IMO0002 Reserved for concepts with insufficient information to code with codable children: Secondary | ICD-10-CM

## 2017-09-08 LAB — POCT GLYCOSYLATED HEMOGLOBIN (HGB A1C): Hemoglobin A1C: 7.5 % — AB (ref 4.0–5.6)

## 2017-09-08 MED ORDER — LISINOPRIL 10 MG PO TABS
ORAL_TABLET | ORAL | 3 refills | Status: DC
Start: 2017-09-08 — End: 2018-09-05

## 2017-09-08 MED ORDER — PIOGLITAZONE HCL 30 MG PO TABS: 30.0000 mg | ORAL_TABLET | Freq: Every day | ORAL | 0 refills | Status: DC

## 2017-09-08 MED ORDER — DAPAGLIFLOZIN PROPANEDIOL 10 MG PO TABS: 10.0000 mg | ORAL_TABLET | Freq: Every day | ORAL | 0 refills | Status: DC

## 2017-09-08 MED ORDER — ROSUVASTATIN CALCIUM 20 MG PO TABS: 20.0000 mg | ORAL_TABLET | Freq: Every day | ORAL | 3 refills | Status: DC

## 2017-09-08 NOTE — Progress Notes (Signed)
Subjective:    Patient ID: Tammy Bray, female    DOB: Jan 29, 1961, 57 y.o.   MRN: [id number]  HPI Pt is a 57 yo female with T2DM, HTN, HLD who presents to the clinic for 3 month follow up.   DM- she is checking sugars at home and ranging 105 to 137 in the am. No hypoglycemia. No open sores or wounds. No vision changes or neuropathy. She is not watching her diet. She is not exercising.   HTN- no CP, palpitations, headaches, vision changes.    .. Active Ambulatory Problems    Diagnosis Date Noted  . Diabetes mellitus, type 2 (HCC) 01/13/2013  . Essential hypertension, benign 01/13/2013  . Hyperlipidemia 01/13/2013  . Right knee pain 01/13/2013  . IT band syndrome 01/13/2013  . Right hip pain 01/13/2013  . Tachycardia 11/25/2014  . Uncontrolled type 2 diabetes mellitus with complication, without long-term current use of insulin (HCC) 06/14/2015  . Abdominal pain 02/05/2016  . Hematuria 01/15/2017   Resolved Ambulatory Problems    Diagnosis Date Noted  . Controlled type 2 diabetes mellitus without complication, without long-term current use of insulin (HCC) 08/26/2016   Past Medical History:  Diagnosis Date  . Diabetes mellitus without complication (HCC)   . Hypertension       Review of Systems  All other systems reviewed and are negative.      Objective:   Physical Exam  Constitutional: She is oriented to person, place, and time. She appears well-developed and well-nourished.  HENT:  Head: Normocephalic and atraumatic.  Right Ear: External ear normal.  Left Ear: External ear normal.  Eyes: Conjunctivae and EOM are normal.  Neck: Normal range of motion. Neck supple. No thyromegaly present.  Cardiovascular: Normal rate and regular rhythm.  Pulmonary/Chest: Effort normal and breath sounds normal.  Neurological: She is alert and oriented to person, place, and time.  Psychiatric: She has a normal mood and affect. Her behavior is normal.          Assessment &  Plan:  [name] KitchenMarland KitchenMeggin was seen today for diabetes.  Diagnoses and all orders for this visit:  Uncontrolled type 2 diabetes mellitus with complication, without long-term current use of insulin (HCC) -     POCT HgB A1C -     lisinopril (PRINIVIL,ZESTRIL) 10 MG tablet; TAKE 1 TABLET (10 MG TOTAL) BY MOUTH DAILY. -     rosuvastatin (CRESTOR) 20 MG tablet; Take 1 tablet (20 mg total) by mouth daily. -     dapagliflozin propanediol (FARXIGA) 10 MG TABS tablet; Take 10 mg by mouth daily.  Mixed hyperlipidemia -     rosuvastatin (CRESTOR) 20 MG tablet; Take 1 tablet (20 mg total) by mouth daily.  Other orders -     pioglitazone (ACTOS) 30 MG tablet; Take 1 tablet (30 mg total) by mouth daily. -     Discontinue: dapagliflozin propanediol (FARXIGA) 10 MG TABS tablet; Take 10 mg by mouth daily.    Lab Results  Component Value Date   HGBA1C 7.5 (A) 09/08/2017   A!C better but not at goal.  Pt does not want to be nauseated and declines GLP1. Did not tolerate metformin.  Increased actos and stay on faraxiga.  BP controlled. On ACE.  On STATIN.  Needs eye exam.  .. Diabetic Foot Exam - Simple   Simple Foot Form Visual Inspection No deformities, no ulcerations, no other skin breakdown bilaterally:  Yes Sensation Testing Intact to touch and monofilament testing bilaterally:  Yes Pulse  Check Posterior Tibialis and Dorsalis pulse intact bilaterally:  Yes Comments       Agreed to get cologuard. Faxed request.   Follow up in 3 months.

## 2017-09-08 NOTE — Progress Notes (Signed)
Please send cologuard order form for patient. It does not have to have signed to send.

## 2017-09-10 ENCOUNTER — Encounter: Payer: Self-pay | Admitting: Physician Assistant

## 2017-10-28 ENCOUNTER — Ambulatory Visit: Payer: Self-pay | Admitting: *Deleted

## 2017-11-10 LAB — HM MAMMOGRAPHY

## 2017-11-18 ENCOUNTER — Encounter: Payer: Self-pay | Admitting: Physician Assistant

## 2017-11-29 ENCOUNTER — Other Ambulatory Visit: Payer: Self-pay | Admitting: Physician Assistant

## 2017-11-29 DIAGNOSIS — IMO0002 Reserved for concepts with insufficient information to code with codable children: Secondary | ICD-10-CM

## 2017-11-29 DIAGNOSIS — E118 Type 2 diabetes mellitus with unspecified complications: Principal | ICD-10-CM

## 2017-11-29 DIAGNOSIS — E1165 Type 2 diabetes mellitus with hyperglycemia: Secondary | ICD-10-CM

## 2017-12-01 ENCOUNTER — Ambulatory Visit: Admitting: Physician Assistant

## 2017-12-14 ENCOUNTER — Other Ambulatory Visit: Payer: Self-pay | Admitting: Physician Assistant

## 2018-02-01 ENCOUNTER — Encounter: Payer: Self-pay | Admitting: Physician Assistant

## 2018-02-01 LAB — COLOGUARD

## 2018-03-04 ENCOUNTER — Telehealth: Payer: Self-pay | Admitting: Physician Assistant

## 2018-03-04 ENCOUNTER — Ambulatory Visit (INDEPENDENT_AMBULATORY_CARE_PROVIDER_SITE_OTHER): Payer: Managed Care, Other (non HMO) | Admitting: Physician Assistant

## 2018-03-04 ENCOUNTER — Encounter: Payer: Self-pay | Admitting: Physician Assistant

## 2018-03-04 VITALS — BP 125/81 | HR 93 | Ht 65.0 in | Wt 184.0 lb

## 2018-03-04 DIAGNOSIS — R350 Frequency of micturition: Secondary | ICD-10-CM

## 2018-03-04 DIAGNOSIS — N898 Other specified noninflammatory disorders of vagina: Secondary | ICD-10-CM

## 2018-03-04 DIAGNOSIS — E119 Type 2 diabetes mellitus without complications: Secondary | ICD-10-CM

## 2018-03-04 LAB — POCT URINALYSIS DIPSTICK
Bilirubin, UA: NEGATIVE
Glucose, UA: POSITIVE — AB
Ketones, UA: NEGATIVE
Nitrite, UA: NEGATIVE
Protein, UA: NEGATIVE
Spec Grav, UA: 1.01 (ref 1.010–1.025)
Urobilinogen, UA: 0.2 E.U./dL
pH, UA: 5.5 (ref 5.0–8.0)

## 2018-03-04 LAB — POCT GLYCOSYLATED HEMOGLOBIN (HGB A1C): Hemoglobin A1C: 6.8 % — AB (ref 4.0–5.6)

## 2018-03-04 MED ORDER — FLUCONAZOLE 150 MG PO TABS: 150.0000 mg | ORAL_TABLET | Freq: Once | ORAL | 0 refills | Status: AC

## 2018-03-04 MED ORDER — PIOGLITAZONE HCL 30 MG PO TABS: 30.0000 mg | ORAL_TABLET | Freq: Every day | ORAL | 1 refills | Status: DC

## 2018-03-04 MED ORDER — DAPAGLIFLOZIN PROPANEDIOL 10 MG PO TABS: 10.0000 mg | ORAL_TABLET | Freq: Every day | ORAL | 1 refills | Status: DC

## 2018-03-04 NOTE — Progress Notes (Signed)
Subjective:    Patient ID: Tammy Bray, female    DOB: 1961-02-17, 57 y.o.   MRN: [id number]  HPI  Pt is a 57 yo female with T2DM, HTN, HLD who presents to the clinic for 3 month follow up.   DM- pt is not checking sugars. Taking medication daily. No hypoglycemic events. No open sores or wounds.   HTN- no problems or concerns. Denies any CP, palpitations, vision changes.   Pt went to UC for UTI last week. Treated with abx. Symptoms resolved mostly but has some residual urinary frequency and itching. No fever, chills, body aches.   cologuard done but we have not received results.   .. Active Ambulatory Problems    Diagnosis Date Noted  . Diabetes mellitus, type 2 (HCC) 01/13/2013  . Essential hypertension, benign 01/13/2013  . Hyperlipidemia 01/13/2013  . Right knee pain 01/13/2013  . IT band syndrome 01/13/2013  . Right hip pain 01/13/2013  . Tachycardia 11/25/2014  . Uncontrolled type 2 diabetes mellitus with complication, without long-term current use of insulin (HCC) 06/14/2015  . Abdominal pain 02/05/2016  . Controlled type 2 diabetes mellitus without complication, without long-term current use of insulin (HCC) 08/26/2016  . Hematuria 01/15/2017   Resolved Ambulatory Problems    Diagnosis Date Noted  . No Resolved Ambulatory Problems   Past Medical History:  Diagnosis Date  . Diabetes mellitus without complication (HCC)   . Hypertension      Review of Systems  All other systems reviewed and are negative.      Objective:   Physical Exam Constitutional:      Appearance: Normal appearance.  HENT:     Head: Normocephalic.  Cardiovascular:     Rate and Rhythm: Normal rate and regular rhythm.  Pulmonary:     Effort: Pulmonary effort is normal.     Breath sounds: Normal breath sounds.  Neurological:     General: No focal deficit present.     Mental Status: She is alert and oriented to person, place, and time.  Psychiatric:        Mood and Affect: Mood  normal.        Behavior: Behavior normal.           Assessment & Plan:   [name] KitchenMarland KitchenJennay was seen today for follow-up.  Diagnoses and all orders for this visit:  Controlled type 2 diabetes mellitus without complication, without long-term current use of insulin (HCC) -     POCT glycosylated hemoglobin (Hb A1C) -     dapagliflozin propanediol (FARXIGA) 10 MG TABS tablet; Take 10 mg by mouth daily. -     pioglitazone (ACTOS) 30 MG tablet; Take 1 tablet (30 mg total) by mouth daily.  Urinary frequency -     POCT Urinalysis Dipstick -     Urine Culture  Vaginal itching -     fluconazole (DIFLUCAN) 150 MG tablet; Take 1 tablet (150 mg total) by mouth once for 1 dose. Repeat in 48-72 if symptoms persist. -     Wet prep, genital    .. Lab Results  Component Value Date   HGBA1C 6.8 (A) 03/04/2018   .[name] Kitchen Results for orders placed or performed in visit on 03/04/18  POCT glycosylated hemoglobin (Hb A1C)  Result Value Ref Range   Hemoglobin A1C 6.8 (A) 4.0 - 5.6 %   HbA1c POC (<> result, manual entry)     HbA1c, POC (prediabetic range)     HbA1c, POC (controlled diabetic range)  POCT Urinalysis Dipstick  Result Value Ref Range   Color, UA light yellow    Clarity, UA slightly cloudy    Glucose, UA Positive (A) Negative   Bilirubin, UA negative    Ketones, UA negative    Spec Grav, UA 1.010 1.010 - 1.025   Blood, UA small    pH, UA 5.5 5.0 - 8.0   Protein, UA Negative Negative   Urobilinogen, UA 0.2 0.2 or 1.0 E.U./dL   Nitrite, UA negative    Leukocytes, UA Small (1+) (A) Negative   Appearance     Odor      Recent UTI but has been treated with abx. More likely after abx yeast infection. Start with diflucan. Will culture urine and treat accordingly.   A!C looks much better at 6.8.  Continue with diet/exercise/medication.  BP looks great. On ACE.  On STATIn.  Flu and pneumonia up to date.  Follow up in 3 months.  Will call and get eye exam.   .. Diabetic Foot Exam -  Simple   Simple Foot Form Visual Inspection No deformities, no ulcerations, no other skin breakdown bilaterally:  Yes Sensation Testing Intact to touch and monofilament testing bilaterally:  Yes Pulse Check Posterior Tibialis and Dorsalis pulse intact bilaterally:  Yes Comments    Per patient she was told cologuard results sent to use. Will have nurse call cologuard and get results.

## 2018-03-04 NOTE — Progress Notes (Signed)
fo

## 2018-03-04 NOTE — Telephone Encounter (Signed)
Can we call cologuard and get report. Pt sent back in November and was told report was sent to office. I do not see it.

## 2018-03-06 LAB — URINE CULTURE
MICRO NUMBER:: 91526982
SPECIMEN QUALITY:: ADEQUATE

## 2018-03-06 NOTE — Progress Notes (Signed)
Call pt: no UTI found. How are symptoms after taking diflucan?

## 2018-03-07 ENCOUNTER — Ambulatory Visit: Payer: Managed Care, Other (non HMO) | Admitting: Physician Assistant

## 2018-03-12 LAB — WET PREP, GENITAL

## 2018-03-12 LAB — SPECIMEN STATUS REPORT

## 2018-03-14 NOTE — Progress Notes (Signed)
Maybe we can go over the exact way to send a wet prep so they result stat and appropriately.

## 2018-03-18 NOTE — Telephone Encounter (Signed)
Ok please let patient know we confirm cologuard was negative.

## 2018-03-18 NOTE — Telephone Encounter (Signed)
Called and spoke with Cologuard associate. Associate stated she will fax over results again to our fax number. She did say that the results were negative, but the documentation is being faxed over and should be here by next week.

## 2018-03-22 NOTE — Telephone Encounter (Signed)
Called and left a voicemail for patient with negative results. Call back information was provided if any further questions or concerns.

## 2018-04-08 ENCOUNTER — Ambulatory Visit (INDEPENDENT_AMBULATORY_CARE_PROVIDER_SITE_OTHER): Payer: BLUE CROSS/BLUE SHIELD | Admitting: Physician Assistant

## 2018-04-08 ENCOUNTER — Encounter: Payer: Self-pay | Admitting: Physician Assistant

## 2018-04-08 VITALS — BP 123/81 | HR 87 | Ht 65.0 in | Wt 191.0 lb

## 2018-04-08 DIAGNOSIS — N898 Other specified noninflammatory disorders of vagina: Secondary | ICD-10-CM | POA: Diagnosis not present

## 2018-04-08 LAB — POCT URINALYSIS DIPSTICK
Bilirubin, UA: NEGATIVE
Glucose, UA: POSITIVE — AB
Ketones, UA: NEGATIVE
Nitrite, UA: NEGATIVE
Protein, UA: NEGATIVE
Spec Grav, UA: 1.01 (ref 1.010–1.025)
Urobilinogen, UA: 0.2 E.U./dL
pH, UA: 7 (ref 5.0–8.0)

## 2018-04-08 NOTE — Progress Notes (Signed)
Subjective:    Patient ID: Tammy Bray, female    DOB: 11/18/1960, 58 y.o.   MRN: [id number]  HPI: This is a 58 year old female who presents to the clinic today with continued complaints of a yeast infection such as discharge, odor, and itching. She denies fever, nausea, vomiting, diarrhea, hematuria, dysuria, abdominal pain, and new sexual partners. She was recently treated for a UTI, and then she developed symptoms of a yeast infection although a wet prep was never processed.   One sexual partner with no concerns of STI's.   .. Active Ambulatory Problems    Diagnosis Date Noted  . Diabetes mellitus, type 2 (HCC) 01/13/2013  . Essential hypertension, benign 01/13/2013  . Hyperlipidemia 01/13/2013  . Right knee pain 01/13/2013  . IT band syndrome 01/13/2013  . Right hip pain 01/13/2013  . Tachycardia 11/25/2014  . Uncontrolled type 2 diabetes mellitus with complication, without long-term current use of insulin (HCC) 06/14/2015  . Abdominal pain 02/05/2016  . Controlled type 2 diabetes mellitus without complication, without long-term current use of insulin (HCC) 08/26/2016  . Hematuria 01/15/2017   Resolved Ambulatory Problems    Diagnosis Date Noted  . No Resolved Ambulatory Problems   Past Medical History:  Diagnosis Date  . Diabetes mellitus without complication (HCC)   . Hypertension        Review of Systems  Constitutional: Negative for activity change, chills and fever.  Gastrointestinal: Negative for diarrhea, nausea and vomiting.  Genitourinary: Positive for vaginal discharge. Negative for dysuria, flank pain, hematuria and vaginal bleeding.  Musculoskeletal: Negative for back pain.  Skin: Negative for color change and rash.       Objective:   Physical Exam Vitals signs reviewed.  Constitutional:      General: She is not in acute distress.    Appearance: She is not ill-appearing.  HENT:     Head: Normocephalic and atraumatic.     Right Ear: External  ear normal.     Left Ear: External ear normal.     Nose: Nose normal. No congestion or rhinorrhea.     Mouth/Throat:     Mouth: Mucous membranes are moist.     Pharynx: Oropharynx is clear. No oropharyngeal exudate or posterior oropharyngeal erythema.  Eyes:     General: No scleral icterus.       Right eye: No discharge.        Left eye: No discharge.     Extraocular Movements: Extraocular movements intact.     Conjunctiva/sclera: Conjunctivae normal.     Pupils: Pupils are equal, round, and reactive to light.  Cardiovascular:     Rate and Rhythm: Normal rate.     Pulses: Normal pulses.     Heart sounds: Normal heart sounds.  Pulmonary:     Effort: Pulmonary effort is normal. No respiratory distress.     Breath sounds: Normal breath sounds. No wheezing, rhonchi or rales.  Abdominal:     General: Abdomen is flat. Bowel sounds are normal. There is no distension.     Tenderness: There is no abdominal tenderness. There is no guarding.  Skin:    General: Skin is warm and dry.     Capillary Refill: Capillary refill takes less than 2 seconds.     Coloration: Skin is not jaundiced.     Findings: No bruising or rash.  Neurological:     Mental Status: She is alert.     Coordination: Coordination normal.     Gait:  Gait normal.  Psychiatric:        Mood and Affect: Mood normal.        Behavior: Behavior normal.        Thought Content: Thought content normal.           Assessment & Plan:  [name] KitchenMarland KitchenKaleiah was seen today for vaginitis.  Diagnoses and all orders for this visit:  Vaginal itching -     Wet prep, genital -     POCT Urinalysis Dipstick -     Urine Culture  Vaginal discharge -     Wet prep, genital -     POCT Urinalysis Dipstick -     Urine Culture   .[name] Kitchen Results for orders placed or performed in visit on 04/08/18  POCT Urinalysis Dipstick  Result Value Ref Range   Color, UA yellow    Clarity, UA clear    Glucose, UA Positive (A) Negative   Bilirubin, UA  negative    Ketones, UA negative    Spec Grav, UA 1.010 1.010 - 1.025   Blood, UA small    pH, UA 7.0 5.0 - 8.0   Protein, UA Negative Negative   Urobilinogen, UA 0.2 0.2 or 1.0 E.U./dL   Nitrite, UA negative    Leukocytes, UA Trace (A) Negative   Appearance     Odor       Sent wet prep to check for candida and BV. Will send either Diflucan or Metronidazole pending these results. It could be that we did not completely treat yeast with one tablet. Urine positive for trace leukocytes and blood but could be from candida or BV as well and we will treat either of these first before treating UTI. Will culture.  Discussed possibility of symptoms being caused by [name]. Will monitor for recurrence of symptoms and will consider stopping [name] should this recur. Will call pt with wet prep results. Contact office should symptoms worsen or not improve.   [name] KitchenHarlon Flor PA-C, have reviewed and agree with the above documentation in it's entirety.

## 2018-04-10 LAB — URINE CULTURE
MICRO NUMBER:: 101733
SPECIMEN QUALITY:: ADEQUATE

## 2018-04-11 NOTE — Progress Notes (Signed)
Once again Tammy Bray did not order wet prep stat. This is 2nd time this has happened for this patient.   Let her know does not appear to be any bacterial infection.   Can we see if they have any results for wet prep?

## 2018-04-18 LAB — WET PREP, GENITAL

## 2018-04-18 LAB — SPECIMEN STATUS REPORT

## 2018-04-18 NOTE — Progress Notes (Signed)
Call pt: if still having symptoms I can treat empirically for both yeast and BV.

## 2018-05-03 NOTE — Progress Notes (Signed)
Pain during intercourse is at your age could be due to vaginal dryness. Consider coconut oil before intercourse or a non scented lubrication.

## 2018-06-03 ENCOUNTER — Ambulatory Visit (INDEPENDENT_AMBULATORY_CARE_PROVIDER_SITE_OTHER): Payer: BLUE CROSS/BLUE SHIELD | Admitting: Physician Assistant

## 2018-06-03 ENCOUNTER — Encounter: Payer: Self-pay | Admitting: Physician Assistant

## 2018-06-03 ENCOUNTER — Other Ambulatory Visit: Payer: Self-pay

## 2018-06-03 VITALS — BP 125/65 | HR 117 | Temp 99.3°F | Ht 65.0 in | Wt 187.0 lb

## 2018-06-03 DIAGNOSIS — E119 Type 2 diabetes mellitus without complications: Secondary | ICD-10-CM | POA: Diagnosis not present

## 2018-06-03 DIAGNOSIS — I10 Essential (primary) hypertension: Secondary | ICD-10-CM

## 2018-06-03 DIAGNOSIS — E782 Mixed hyperlipidemia: Secondary | ICD-10-CM

## 2018-06-03 DIAGNOSIS — N898 Other specified noninflammatory disorders of vagina: Secondary | ICD-10-CM

## 2018-06-03 LAB — POCT GLYCOSYLATED HEMOGLOBIN (HGB A1C): Hemoglobin A1C: 6.9 % — AB (ref 4.0–5.6)

## 2018-06-03 MED ORDER — FLUCONAZOLE 150 MG PO TABS: 150.0000 mg | ORAL_TABLET | Freq: Once | ORAL | 0 refills | Status: AC

## 2018-06-03 NOTE — Progress Notes (Signed)
Subjective:    Patient ID: Tammy Bray, female    DOB: 09-18-1960, 58 y.o.   MRN: [id number]  HPI  Pt is a 58 yo female with T2DM, HTN who presents to the clinic for 3 month follow up.   She continues to have some residual irritating vaginal discharge. Last swab was ordered wrong and never resulted. She has been treated for both bV and yeast. Seemed to get better but then return. She is on GLT-2 daily.   DM-pt is not checking her sugars regularly. She denies any hypoglycemic symptoms. She is taking medication daily that consists of faraxiga and actos. No open sores or wounds. She is not on any diabetic.   Pt denies any CP, palpitations, headaches or vision changes. Taking lisinopril daily.   .. Active Ambulatory Problems    Diagnosis Date Noted  . Diabetes mellitus, type 2 (HCC) 01/13/2013  . Essential hypertension, benign 01/13/2013  . Hyperlipidemia 01/13/2013  . Right knee pain 01/13/2013  . IT band syndrome 01/13/2013  . Right hip pain 01/13/2013  . Tachycardia 11/25/2014  . Abdominal pain 02/05/2016  . Controlled type 2 diabetes mellitus without complication, without long-term current use of insulin (HCC) 08/26/2016  . Hematuria 01/15/2017  . Vaginal discharge 06/06/2018   Resolved Ambulatory Problems    Diagnosis Date Noted  . Uncontrolled type 2 diabetes mellitus with complication, without long-term current use of insulin (HCC) 06/14/2015   Past Medical History:  Diagnosis Date  . Diabetes mellitus without complication (HCC)   . Hypertension       Review of Systems  All other systems reviewed and are negative.     Objective:   Physical Exam Vitals signs reviewed.  Constitutional:      Appearance: Normal appearance.  HENT:     Head: Normocephalic and atraumatic.     Right Ear: Tympanic membrane normal.     Left Ear: Tympanic membrane normal.  Neck:     Musculoskeletal: Normal range of motion.  Cardiovascular:     Rate and Rhythm: Normal rate and  regular rhythm.     Pulses: Normal pulses.     Heart sounds: Normal heart sounds.  Pulmonary:     Effort: Pulmonary effort is normal.     Breath sounds: Normal breath sounds.  Neurological:     General: No focal deficit present.     Mental Status: She is alert and oriented to person, place, and time.  Psychiatric:        Mood and Affect: Mood normal.        Behavior: Behavior normal.           Assessment & Plan:  [name] KitchenMarland KitchenBrinlynn was seen today for diabetes.  Diagnoses and all orders for this visit:  Controlled type 2 diabetes mellitus without complication, without long-term current use of insulin (HCC) -     POCT HgB A1C -     Dulaglutide (TRULICITY) 0.75 MG/0.5ML SOPN; Inject 0.75 mg into the skin once a week.  Vaginal discharge -     fluconazole (DIFLUCAN) 150 MG tablet; Take 1 tablet (150 mg total) by mouth once for 1 dose.  Essential hypertension, benign  Mixed hyperlipidemia     .[name] Kitchen Results for orders placed or performed in visit on 06/03/18  POCT HgB A1C  Result Value Ref Range   Hemoglobin A1C 6.9 (A) 4.0 - 5.6 %   HbA1c POC (<> result, manual entry)     HbA1c, POC (prediabetic range)     HbA1c,  POC (controlled diabetic range)     A!C under 7 but up from last check.  Lets stop faraxiga for now and see if vaginal discharge will improve. Take one diflucan after stopping. Yeast can be side effect of GLT-2. Added trulicity weekly. Discussed how to use and side effects. Will see if can get samples since patient is concerned about using and tolerating.  On ACE. BP controlled.  On STATIN.  Needs eye exam.  Vaccines up to date.   Follow up in 3 months.

## 2018-06-06 ENCOUNTER — Telehealth: Payer: Self-pay | Admitting: Physician Assistant

## 2018-06-06 ENCOUNTER — Encounter: Payer: Self-pay | Admitting: Physician Assistant

## 2018-06-06 DIAGNOSIS — N898 Other specified noninflammatory disorders of vagina: Secondary | ICD-10-CM | POA: Insufficient documentation

## 2018-06-06 MED ORDER — DULAGLUTIDE 0.75 MG/0.5ML ~~LOC~~ SOAJ: 0.7500 mg | SUBCUTANEOUS | 2 refills | Status: DC

## 2018-06-06 NOTE — Telephone Encounter (Signed)
Ok call patient. Let her know since no samples can go out I sent trulicity to the pharmacy. She can pick up a card.

## 2018-06-06 NOTE — Telephone Encounter (Signed)
I spoke with Tammy Bray and he states that his company is not allowing any shipment of samples especially liquids at this time due to the [place] Virus. Please advise.

## 2018-06-06 NOTE — Telephone Encounter (Signed)
Can we call the trulicity drug rep for a 2 samples?

## 2018-06-06 NOTE — Telephone Encounter (Signed)
Left brief VM that we are unable to get samples at this time and PCP has sent in a prescription to the pharmacy. MyChart message sent as well. Patient was asked to call back with any questions.

## 2018-06-29 ENCOUNTER — Other Ambulatory Visit: Payer: Self-pay | Admitting: Physician Assistant

## 2018-06-29 DIAGNOSIS — E119 Type 2 diabetes mellitus without complications: Secondary | ICD-10-CM

## 2018-09-05 ENCOUNTER — Ambulatory Visit (INDEPENDENT_AMBULATORY_CARE_PROVIDER_SITE_OTHER): Payer: BC Managed Care – PPO | Admitting: Physician Assistant

## 2018-09-05 ENCOUNTER — Encounter: Payer: Self-pay | Admitting: Physician Assistant

## 2018-09-05 VITALS — BP 107/61 | HR 79 | Temp 98.0°F | Ht 65.0 in | Wt 185.0 lb

## 2018-09-05 DIAGNOSIS — N898 Other specified noninflammatory disorders of vagina: Secondary | ICD-10-CM | POA: Diagnosis not present

## 2018-09-05 DIAGNOSIS — E118 Type 2 diabetes mellitus with unspecified complications: Secondary | ICD-10-CM | POA: Diagnosis not present

## 2018-09-05 DIAGNOSIS — E1165 Type 2 diabetes mellitus with hyperglycemia: Secondary | ICD-10-CM

## 2018-09-05 DIAGNOSIS — E119 Type 2 diabetes mellitus without complications: Secondary | ICD-10-CM | POA: Diagnosis not present

## 2018-09-05 DIAGNOSIS — E782 Mixed hyperlipidemia: Secondary | ICD-10-CM

## 2018-09-05 DIAGNOSIS — IMO0002 Reserved for concepts with insufficient information to code with codable children: Secondary | ICD-10-CM

## 2018-09-05 LAB — POCT GLYCOSYLATED HEMOGLOBIN (HGB A1C): Hemoglobin A1C: 6.3 % — AB (ref 4.0–5.6)

## 2018-09-05 MED ORDER — FARXIGA 10 MG PO TABS: 10.0000 mg | ORAL_TABLET | Freq: Every day | ORAL | 1 refills | Status: DC

## 2018-09-05 MED ORDER — PIOGLITAZONE HCL 30 MG PO TABS: 30.0000 mg | ORAL_TABLET | Freq: Every day | ORAL | 1 refills | Status: DC

## 2018-09-05 MED ORDER — ROSUVASTATIN CALCIUM 20 MG PO TABS: 20.0000 mg | ORAL_TABLET | Freq: Every day | ORAL | 3 refills | Status: DC

## 2018-09-05 MED ORDER — LISINOPRIL 10 MG PO TABS: ORAL_TABLET | ORAL | 3 refills | Status: DC

## 2018-09-05 MED ORDER — ESTRADIOL 0.1 MG/GM VA CREA: 1.0000 | TOPICAL_CREAM | VAGINAL | 2 refills | Status: DC

## 2018-09-05 NOTE — Progress Notes (Signed)
Subjective:    Patient ID: Tammy Bray, female    DOB: 1960/05/04, 58 y.o.   MRN: [id number]  HPI  Pt is a 58 yo female with T2DM, HTN, vaginal dryness who presents to the clinic for 3 month follow up.   DM- checking sugars at home and ranging 120-130 in ams. No hypoglycemic events. Did not start trulicity. She has started more diet changes and exercise.   No chest pains, SOB, palpitations, headaches.   She does complain of vaginal dryness and irritation. No discharge.   .. Active Ambulatory Problems    Diagnosis Date Noted  . Diabetes mellitus, type 2 (HCC) 01/13/2013  . Essential hypertension, benign 01/13/2013  . Hyperlipidemia 01/13/2013  . Right knee pain 01/13/2013  . IT band syndrome 01/13/2013  . Right hip pain 01/13/2013  . Tachycardia 11/25/2014  . Abdominal pain 02/05/2016  . Controlled type 2 diabetes mellitus without complication, without long-term current use of insulin (HCC) 08/26/2016  . Hematuria 01/15/2017  . Vaginal discharge 06/06/2018   Resolved Ambulatory Problems    Diagnosis Date Noted  . Uncontrolled type 2 diabetes mellitus with complication, without long-term current use of insulin (HCC) 06/14/2015   Past Medical History:  Diagnosis Date  . Diabetes mellitus without complication (HCC)   . Hypertension      Review of Systems  All other systems reviewed and are negative.      Objective:   Physical Exam Vitals signs reviewed.  Constitutional:      Appearance: Normal appearance.  HENT:     Head: Normocephalic.  Cardiovascular:     Rate and Rhythm: Normal rate and regular rhythm.     Pulses: Normal pulses.  Pulmonary:     Effort: Pulmonary effort is normal.     Breath sounds: Normal breath sounds.  Skin:    General: Skin is warm.  Neurological:     General: No focal deficit present.     Mental Status: She is alert and oriented to person, place, and time.  Psychiatric:        Mood and Affect: Mood normal.            Assessment & Plan:  [name] KitchenMarland KitchenLillienne was seen today for diabetes.  Diagnoses and all orders for this visit:  Controlled type 2 diabetes mellitus without complication, without long-term current use of insulin (HCC) -     POCT glycosylated hemoglobin (Hb A1C) -     Discontinue: pioglitazone (ACTOS) 30 MG tablet; Take 1 tablet (30 mg total) by mouth daily. -     COMPLETE METABOLIC PANEL WITH GFR -     rosuvastatin (CRESTOR) 20 MG tablet; Take 1 tablet (20 mg total) by mouth daily. -     pioglitazone (ACTOS) 30 MG tablet; Take 1 tablet (30 mg total) by mouth daily. -     lisinopril (ZESTRIL) 10 MG tablet; TAKE 1 TABLET (10 MG TOTAL) BY MOUTH DAILY. -     FARXIGA 10 MG TABS tablet; Take 10 mg by mouth daily.  Uncontrolled type 2 diabetes mellitus with complication, without long-term current use of insulin (HCC) -     Discontinue: lisinopril (ZESTRIL) 10 MG tablet; TAKE 1 TABLET (10 MG TOTAL) BY MOUTH DAILY. -     Discontinue: rosuvastatin (CRESTOR) 20 MG tablet; Take 1 tablet (20 mg total) by mouth daily.  Mixed hyperlipidemia -     Discontinue: rosuvastatin (CRESTOR) 20 MG tablet; Take 1 tablet (20 mg total) by mouth daily. -  Lipid Panel w/reflex Direct LDL -     rosuvastatin (CRESTOR) 20 MG tablet; Take 1 tablet (20 mg total) by mouth daily.  Vaginal dryness -     estradiol (ESTRACE) 0.1 MG/GM vaginal cream; Place 1 Applicatorful vaginally 3 (three) times a week.  Other orders -     Discontinue: FARXIGA 10 MG TABS tablet; Take 10 mg by mouth daily.   .. Results for orders placed or performed in visit on 09/05/18  POCT glycosylated hemoglobin (Hb A1C)  Result Value Ref Range   Hemoglobin A1C 6.3 (A) 4.0 - 5.6 %   HbA1c POC (<> result, manual entry)     HbA1c, POC (prediabetic range)     HbA1c, POC (controlled diabetic range)     Continue on faraxiga and actos.  Continue with diet and exercise.  On ACE.BP great. On statin. Recheck lipid today.  Follow up in 3 months.   Added  estrace cream for vaginal dryness. Start 3-4 times a week and then use as needed for symptomatic relief.

## 2018-09-06 LAB — COMPLETE METABOLIC PANEL WITH GFR
AG Ratio: 1.4 (calc) (ref 1.0–2.5)
ALT: 11 U/L (ref 6–29)
AST: 15 U/L (ref 10–35)
Albumin: 4.3 g/dL (ref 3.6–5.1)
Alkaline phosphatase (APISO): 58 U/L (ref 37–153)
BUN: 18 mg/dL (ref 7–25)
CO2: 27 mmol/L (ref 20–32)
Calcium: 9.7 mg/dL (ref 8.6–10.4)
Chloride: 106 mmol/L (ref 98–110)
Creat: 1.03 mg/dL (ref 0.50–1.05)
GFR, Est African American: 69 mL/min/{1.73_m2} (ref >=60)
GFR, Est Non African American: 60 mL/min/{1.73_m2} (ref >=60)
Globulin: 3.1 g/dL (calc) (ref 1.9–3.7)
Glucose, Bld: 118 mg/dL — ABNORMAL HIGH (ref 65–99)
Potassium: 4.1 mmol/L (ref 3.5–5.3)
Sodium: 140 mmol/L (ref 135–146)
Total Bilirubin: 0.7 mg/dL (ref 0.2–1.2)
Total Protein: 7.4 g/dL (ref 6.1–8.1)

## 2018-09-06 LAB — LIPID PANEL W/REFLEX DIRECT LDL
Cholesterol: 195 mg/dL (ref <200)
HDL: 64 mg/dL (ref >=50)
LDL Cholesterol (Calc): 115 mg/dL (calc) — ABNORMAL HIGH
Non-HDL Cholesterol (Calc): 131 mg/dL (calc) — ABNORMAL HIGH (ref <130)
Total CHOL/HDL Ratio: 3 (calc) (ref <5.0)
Triglycerides: 64 mg/dL (ref <150)

## 2018-09-06 NOTE — Progress Notes (Signed)
LDL went up. With Diabetes goal LDL is under 70. Is patient taking crestor?   Kidney and liver look great.

## 2018-09-07 MED ORDER — ROSUVASTATIN CALCIUM 40 MG PO TABS: 40.0000 mg | ORAL_TABLET | Freq: Every day | ORAL | 3 refills | Status: DC

## 2018-09-07 NOTE — Progress Notes (Signed)
Sent crestor 40mg  to pharmacy to take once a day.

## 2018-09-07 NOTE — Addendum Note (Signed)
Addended by: Donella Stade on: 09/07/2018 03:08 PM   Modules accepted: Orders

## 2018-09-23 DIAGNOSIS — B373 Candidiasis of vulva and vagina: Secondary | ICD-10-CM | POA: Diagnosis not present

## 2018-09-29 ENCOUNTER — Telehealth: Payer: Self-pay | Admitting: Neurology

## 2018-09-29 NOTE — Telephone Encounter (Signed)
Patient left a vm stating she used the vaginal cream, but now discharge has come back.  Called patient. Last note states she was to use cream 3-4 times a week and then use as needed. Need to see how much she is currently using cream. Left message on machine for patient to call back.

## 2018-09-30 MED ORDER — FLUCONAZOLE 150 MG PO TABS: 150.0000 mg | ORAL_TABLET | Freq: Once | ORAL | 0 refills | Status: AC

## 2018-09-30 NOTE — Telephone Encounter (Signed)
Patient made aware of instructions/recommendations. She will stop Faraxgia and take Diflucan. Will let us know if this doesn't help.

## 2018-09-30 NOTE — Telephone Encounter (Addendum)
Patient called back and left vm that she used cream 3 times in one week. Sounds like she has not used it since.   Called and spoke with patient. She states it helped the first two times, but the third time it seemed to make things worse, she started having discharge again and it hasn't gone away. She has not tried it since. Please advise.

## 2018-09-30 NOTE — Telephone Encounter (Signed)
The cream was for her vaginal dryness not abnormal discharge. I think we are going to have to stop faraxiga since I think this is causing the vaginal issues since starting. I will give you 2 diflucan and stop faraxgia and lets see how symptoms go and then how sugars are at next check.

## 2018-10-24 LAB — HM DIABETES EYE EXAM

## 2018-11-15 DIAGNOSIS — Z1231 Encounter for screening mammogram for malignant neoplasm of breast: Secondary | ICD-10-CM | POA: Diagnosis not present

## 2018-11-15 LAB — HM MAMMOGRAPHY

## 2018-11-17 ENCOUNTER — Encounter: Payer: Self-pay | Admitting: Physician Assistant

## 2018-12-06 ENCOUNTER — Ambulatory Visit (INDEPENDENT_AMBULATORY_CARE_PROVIDER_SITE_OTHER): Payer: BC Managed Care – PPO | Admitting: Physician Assistant

## 2018-12-06 ENCOUNTER — Encounter: Payer: Self-pay | Admitting: Physician Assistant

## 2018-12-06 ENCOUNTER — Other Ambulatory Visit: Payer: Self-pay

## 2018-12-06 VITALS — BP 113/64 | HR 70 | Ht 65.0 in | Wt 190.0 lb

## 2018-12-06 DIAGNOSIS — E782 Mixed hyperlipidemia: Secondary | ICD-10-CM

## 2018-12-06 DIAGNOSIS — I10 Essential (primary) hypertension: Secondary | ICD-10-CM | POA: Diagnosis not present

## 2018-12-06 DIAGNOSIS — E6609 Other obesity due to excess calories: Secondary | ICD-10-CM | POA: Insufficient documentation

## 2018-12-06 DIAGNOSIS — Z23 Encounter for immunization: Secondary | ICD-10-CM

## 2018-12-06 DIAGNOSIS — E119 Type 2 diabetes mellitus without complications: Secondary | ICD-10-CM | POA: Diagnosis not present

## 2018-12-06 DIAGNOSIS — Z6831 Body mass index (BMI) 31.0-31.9, adult: Secondary | ICD-10-CM

## 2018-12-06 LAB — POCT GLYCOSYLATED HEMOGLOBIN (HGB A1C): Hemoglobin A1C: 6.6 % — AB (ref 4.0–5.6)

## 2018-12-06 NOTE — Progress Notes (Signed)
Subjective:    Patient ID: Tammy Bray, female    DOB: Sep 15, 1960, 58 y.o.   MRN: [id number]  HPI  Pt is a 58 yo female with HTN, T2DM who presents to the clinic for 3 month follow up.   Pt is doing good. She is not checking her sugars but taking faraxiga and actos with no problems. No open sores or wounds. No hypoglycemia. No CP, palpitations, headaches or vision changes. She has not had any gynecological symptoms.   .. Active Ambulatory Problems    Diagnosis Date Noted  . Diabetes mellitus, type 2 (HCC) 01/13/2013  . Essential hypertension, benign 01/13/2013  . Hyperlipidemia 01/13/2013  . Right knee pain 01/13/2013  . IT band syndrome 01/13/2013  . Right hip pain 01/13/2013  . Tachycardia 11/25/2014  . Abdominal pain 02/05/2016  . Controlled type 2 diabetes mellitus without complication, without long-term current use of insulin (HCC) 08/26/2016  . Hematuria 01/15/2017  . Vaginal discharge 06/06/2018  . Class 1 obesity due to excess calories with serious comorbidity and body mass index (BMI) of 31.0 to 31.9 in adult 12/06/2018   Resolved Ambulatory Problems    Diagnosis Date Noted  . Uncontrolled type 2 diabetes mellitus with complication, without long-term current use of insulin (HCC) 06/14/2015   Past Medical History:  Diagnosis Date  . Diabetes mellitus without complication (HCC)   . Hypertension      Review of Systems  All other systems reviewed and are negative.      Objective:   Physical Exam Vitals signs reviewed.  Constitutional:      Appearance: Normal appearance. She is obese.  HENT:     Head: Normocephalic.  Cardiovascular:     Rate and Rhythm: Normal rate and regular rhythm.     Pulses: Normal pulses.  Pulmonary:     Effort: Pulmonary effort is normal.     Breath sounds: Normal breath sounds.  Neurological:     General: No focal deficit present.     Mental Status: She is alert and oriented to person, place, and time.  Psychiatric:      Mood and Affect: Mood normal.        Behavior: Behavior normal.           Assessment & Plan:  [name] KitchenMarland KitchenChastelyn was seen today for diabetes.  Diagnoses and all orders for this visit:  Controlled type 2 diabetes mellitus without complication, without long-term current use of insulin (HCC) -     POCT glycosylated hemoglobin (Hb A1C)  Flu vaccine need -     Flu Vaccine QUAD 36+ mos IM  Essential hypertension, benign  Mixed hyperlipidemia  Class 1 obesity due to excess calories with serious comorbidity and body mass index (BMI) of 31.0 to 31.9 in adult     .[name] Kitchen Results for orders placed or performed in visit on 12/06/18  POCT glycosylated hemoglobin (Hb A1C)  Result Value Ref Range   Hemoglobin A1C 6.6 (A) 4.0 - 5.6 %   HbA1c POC (<> result, manual entry)     HbA1c, POC (prediabetic range)     HbA1c, POC (controlled diabetic range)     A!C up a bit.  Discussed DM diet. Continue to exercise regularly.  Continue on same medications.  On statin.  On ACE. BP controlled.  UTD eye exam/foot exam. Pneumonia vaccine up to date.  Flu shot given today.   Labs up to date. Medications don't need refill right now.   [name] Kitchen.Discussed low carb diet with 1500  calories and 80g of protein.  Exercising at least 150 minutes a week.  [employer] could be a Chief Technology Officer.  Could consider ozempic for DM control and weight loss in the future. HO given to patient for consideration.

## 2018-12-06 NOTE — Patient Instructions (Signed)
Semaglutide injection solution What is this medicine? SEMAGLUTIDE (Sem a GLOO tide) is used to improve blood sugar control in adults with type 2 diabetes. This medicine may be used with other diabetes medicines. This drug may also reduce the risk of heart attack or stroke if you have type 2 diabetes and risk factors for heart disease. This medicine may be used for other purposes; ask your health care provider or pharmacist if you have questions. COMMON BRAND NAME(S): OZEMPIC What should I tell my health care provider before I take this medicine? They need to know if you have any of these conditions:  endocrine tumors (MEN 2) or if someone in your family had these tumors  eye disease, vision problems  history of pancreatitis  kidney disease  stomach problems  thyroid cancer or if someone in your family had thyroid cancer  an unusual or allergic reaction to semaglutide, other medicines, foods, dyes, or preservatives  pregnant or trying to get pregnant  breast-feeding How should I use this medicine? This medicine is for injection under the skin of your upper leg (thigh), stomach area, or upper arm. It is given once every week (every 7 days). You will be taught how to prepare and give this medicine. Use exactly as directed. Take your medicine at regular intervals. Do not take it more often than directed. If you use this medicine with insulin, you should inject this medicine and the insulin separately. Do not mix them together. Do not give the injections right next to each other. Change (rotate) injection sites with each injection. It is important that you put your used needles and syringes in a special sharps container. Do not put them in a trash can. If you do not have a sharps container, call your pharmacist or healthcare provider to get one. A special MedGuide will be given to you by the pharmacist with each prescription and refill. Be sure to read this information carefully each  time. Talk to your pediatrician regarding the use of this medicine in children. Special care may be needed. Overdosage: If you think you have taken too much of this medicine contact a poison control center or emergency room at once. NOTE: This medicine is only for you. Do not share this medicine with others. What if I miss a dose? If you miss a dose, take it as soon as you can within 5 days after the missed dose. Then take your next dose at your regular weekly time. If it has been longer than 5 days after the missed dose, do not take the missed dose. Take the next dose at your regular time. Do not take double or extra doses. If you have questions about a missed dose, contact your health care provider for advice. What may interact with this medicine?  other medicines for diabetes Many medications may cause changes in blood sugar, these include:  alcohol containing beverages  antiviral medicines for HIV or AIDS  aspirin and aspirin-like drugs  certain medicines for blood pressure, heart disease, irregular heart beat  chromium  diuretics  female hormones, such as estrogens or progestins, birth control pills  fenofibrate  gemfibrozil  isoniazid  lanreotide  female hormones or anabolic steroids  MAOIs like Carbex, Eldepryl, Marplan, Nardil, and Parnate  medicines for weight loss  medicines for allergies, asthma, cold, or cough  medicines for depression, anxiety, or psychotic disturbances  niacin  nicotine  NSAIDs, medicines for pain and inflammation, like ibuprofen or naproxen  octreotide  pasireotide  pentamidine    phenytoin  probenecid  quinolone antibiotics such as ciprofloxacin, levofloxacin, ofloxacin  some herbal dietary supplements  steroid medicines such as prednisone or cortisone  sulfamethoxazole; trimethoprim  thyroid hormones Some medications can hide the warning symptoms of low blood sugar (hypoglycemia). You may need to monitor your blood  sugar more closely if you are taking one of these medications. These include:  beta-blockers, often used for high blood pressure or heart problems (examples include atenolol, metoprolol, propranolol)  clonidine  guanethidine  reserpine This list may not describe all possible interactions. Give your health care provider a list of all the medicines, herbs, non-prescription drugs, or dietary supplements you use. Also tell them if you smoke, drink alcohol, or use illegal drugs. Some items may interact with your medicine. What should I watch for while using this medicine? Visit your doctor or health care professional for regular checks on your progress. Drink plenty of fluids while taking this medicine. Check with your doctor or health care professional if you get an attack of severe diarrhea, nausea, and vomiting. The loss of too much body fluid can make it dangerous for you to take this medicine. A test called the HbA1C (A1C) will be monitored. This is a simple blood test. It measures your blood sugar control over the last 2 to 3 months. You will receive this test every 3 to 6 months. Learn how to check your blood sugar. Learn the symptoms of low and high blood sugar and how to manage them. Always carry a quick-source of sugar with you in case you have symptoms of low blood sugar. Examples include hard sugar candy or glucose tablets. Make sure others know that you can choke if you eat or drink when you develop serious symptoms of low blood sugar, such as seizures or unconsciousness. They must get medical help at once. Tell your doctor or health care professional if you have high blood sugar. You might need to change the dose of your medicine. If you are sick or exercising more than usual, you might need to change the dose of your medicine. Do not skip meals. Ask your doctor or health care professional if you should avoid alcohol. Many nonprescription cough and cold products contain sugar or alcohol.  These can affect blood sugar. Pens should never be shared. Even if the needle is changed, sharing may result in passing of viruses like hepatitis or HIV. Wear a medical ID bracelet or chain, and carry a card that describes your disease and details of your medicine and dosage times. Do not become pregnant while taking this medicine. Women should inform their doctor if they wish to become pregnant or think they might be pregnant. There is a potential for serious side effects to an unborn child. Talk to your health care professional or pharmacist for more information. What side effects may I notice from receiving this medicine? Side effects that you should report to your doctor or health care professional as soon as possible:  allergic reactions like skin rash, itching or hives, swelling of the face, lips, or tongue  breathing problems  changes in vision  diarrhea that continues or is severe  lump or swelling on the neck  severe nausea  signs and symptoms of infection like fever or chills; cough; sore throat; pain or trouble passing urine  signs and symptoms of low blood sugar such as feeling anxious, confusion, dizziness, increased hunger, unusually weak or tired, sweating, shakiness, cold, irritable, headache, blurred vision, fast heartbeat, loss of consciousness    signs and symptoms of kidney injury like trouble passing urine or change in the amount of urine  trouble swallowing  unusual stomach upset or pain  vomiting Side effects that usually do not require medical attention (report to your doctor or health care professional if they continue or are bothersome):  constipation  diarrhea  nausea  pain, redness, or irritation at site where injected  stomach upset This list may not describe all possible side effects. Call your doctor for medical advice about side effects. You may report side effects to FDA at 1-800-FDA-1088. Where should I keep my medicine? Keep out of the reach  of children. Store unopened pens in a refrigerator between 2 and 8 degrees C (36 and 46 degrees F). Do not freeze. Protect from light and heat. After you first use the pen, it can be stored for 56 days at room temperature between 15 and 30 degrees C (59 and 86 degrees F) or in a refrigerator. Throw away your used pen after 56 days or after the expiration date, whichever comes first. Do not store your pen with the needle attached. If the needle is left on, medicine may leak from the pen. NOTE: This sheet is a summary. It may not cover all possible information. If you have questions about this medicine, talk to your doctor, pharmacist, or health care provider.  2020 Elsevier/Gold Standard (2018-04-01 14:25:32)  

## 2018-12-27 ENCOUNTER — Encounter: Payer: Self-pay | Admitting: Physician Assistant

## 2019-03-07 ENCOUNTER — Ambulatory Visit: Payer: BC Managed Care – PPO | Admitting: Physician Assistant

## 2019-03-16 ENCOUNTER — Other Ambulatory Visit: Payer: Self-pay | Admitting: Physician Assistant

## 2019-03-16 DIAGNOSIS — E119 Type 2 diabetes mellitus without complications: Secondary | ICD-10-CM

## 2019-03-16 NOTE — Telephone Encounter (Signed)
Needs appointment

## 2019-04-17 ENCOUNTER — Other Ambulatory Visit: Payer: Self-pay | Admitting: Physician Assistant

## 2019-04-17 DIAGNOSIS — E119 Type 2 diabetes mellitus without complications: Secondary | ICD-10-CM

## 2019-06-14 ENCOUNTER — Other Ambulatory Visit: Payer: Self-pay | Admitting: Physician Assistant

## 2019-06-14 DIAGNOSIS — E119 Type 2 diabetes mellitus without complications: Secondary | ICD-10-CM

## 2019-08-08 ENCOUNTER — Ambulatory Visit (INDEPENDENT_AMBULATORY_CARE_PROVIDER_SITE_OTHER): Payer: BC Managed Care – PPO | Admitting: Physician Assistant

## 2019-08-08 ENCOUNTER — Encounter: Payer: Self-pay | Admitting: Physician Assistant

## 2019-08-08 VITALS — BP 106/66 | HR 73 | Ht 65.0 in | Wt 198.0 lb

## 2019-08-08 DIAGNOSIS — I10 Essential (primary) hypertension: Secondary | ICD-10-CM | POA: Diagnosis not present

## 2019-08-08 DIAGNOSIS — E6609 Other obesity due to excess calories: Secondary | ICD-10-CM

## 2019-08-08 DIAGNOSIS — Z6832 Body mass index (BMI) 32.0-32.9, adult: Secondary | ICD-10-CM

## 2019-08-08 DIAGNOSIS — E119 Type 2 diabetes mellitus without complications: Secondary | ICD-10-CM

## 2019-08-08 DIAGNOSIS — E785 Hyperlipidemia, unspecified: Secondary | ICD-10-CM

## 2019-08-08 LAB — POCT GLYCOSYLATED HEMOGLOBIN (HGB A1C): Hemoglobin A1C: 6.8 % — AB (ref 4.0–5.6)

## 2019-08-08 MED ORDER — PIOGLITAZONE HCL 30 MG PO TABS: 30.0000 mg | ORAL_TABLET | Freq: Every day | ORAL | 0 refills | Status: DC

## 2019-08-08 MED ORDER — LISINOPRIL 10 MG PO TABS: ORAL_TABLET | ORAL | 3 refills | Status: DC

## 2019-08-08 MED ORDER — FARXIGA 10 MG PO TABS: ORAL_TABLET | ORAL | 1 refills | Status: DC

## 2019-08-08 MED ORDER — ROSUVASTATIN CALCIUM 40 MG PO TABS: 40.0000 mg | ORAL_TABLET | Freq: Every day | ORAL | 3 refills | Status: DC

## 2019-08-08 NOTE — Progress Notes (Signed)
Subjective:    Patient ID: Tammy Bray, female    DOB: 02/03/1961, 59 y.o.   MRN: [id number]  HPI  Pt is a 59 yo obese female with T2DM, HTN, HLD who presents to the clinic for 3 month follow up.   She is doing well on [place], [name]. She is not checking her sugars. She denies any open sores or wounds. She is not exercising and not managing her diet.   She denies any CP, palpitations, headaches, vision changes. She is taking lisinopril daily.   .. Active Ambulatory Problems    Diagnosis Date Noted  . Diabetes mellitus, type 2 (HCC) 01/13/2013  . Essential hypertension, benign 01/13/2013  . Hyperlipidemia 01/13/2013  . Right knee pain 01/13/2013  . IT band syndrome 01/13/2013  . Right hip pain 01/13/2013  . Tachycardia 11/25/2014  . Abdominal pain 02/05/2016  . Controlled type 2 diabetes mellitus without complication, without long-term current use of insulin (HCC) 08/26/2016  . Hematuria 01/15/2017  . Vaginal discharge 06/06/2018  . Class 1 obesity due to excess calories with serious comorbidity and body mass index (BMI) of 31.0 to 31.9 in adult 12/06/2018   Resolved Ambulatory Problems    Diagnosis Date Noted  . Uncontrolled type 2 diabetes mellitus with complication, without long-term current use of insulin (HCC) 06/14/2015   Past Medical History:  Diagnosis Date  . Diabetes mellitus without complication (HCC)   . Hypertension      Review of Systems See HPI.     Objective:   Physical Exam Vitals reviewed.  Constitutional:      Appearance: Normal appearance.  HENT:     Head: Normocephalic.  Cardiovascular:     Rate and Rhythm: Normal rate and regular rhythm.     Pulses: Normal pulses.  Neurological:     General: No focal deficit present.     Mental Status: She is alert and oriented to person, place, and time.  Psychiatric:        Mood and Affect: Mood normal.           Assessment & Plan:  [name] KitchenMarland KitchenZahirah was seen today for diabetes.  Diagnoses and all  orders for this visit:  Controlled type 2 diabetes mellitus without complication, without long-term current use of insulin (HCC) -     POCT glycosylated hemoglobin (Hb A1C) -     FARXIGA 10 MG TABS tablet; TAKE 1 TABLET DAILY. -     lisinopril (ZESTRIL) 10 MG tablet; TAKE 1 TABLET (10 MG TOTAL) BY MOUTH DAILY. -     pioglitazone (ACTOS) 30 MG tablet; Take 1 tablet (30 mg total) by mouth daily. -     Lipid Panel w/reflex Direct LDL  Essential hypertension, benign -     lisinopril (ZESTRIL) 10 MG tablet; TAKE 1 TABLET (10 MG TOTAL) BY MOUTH DAILY. -     COMPLETE METABOLIC PANEL WITH GFR  Class 1 obesity due to excess calories with serious comorbidity and body mass index (BMI) of 32.0 to 32.9 in adult  Dyslipidemia, goal LDL below 70 -     rosuvastatin (CRESTOR) 40 MG tablet; Take 1 tablet (40 mg total) by mouth daily.   .. Results for orders placed or performed in visit on 08/08/19  POCT glycosylated hemoglobin (Hb A1C)  Result Value Ref Range   Hemoglobin A1C 6.8 (A) 4.0 - 5.6 %   HbA1c POC (<> result, manual entry)     HbA1c, POC (prediabetic range)     HbA1c, POC (controlled diabetic  range)     A1C up some from 3 months ago. Under 7 as goal.  Continue same medications.  Make sure exercising daily.  Consider IF 16:8. HO given.  BP to goal. On ACE. On STATIN. Labs up to date.  Foot/eye/vaccines UTD.  Follow up in 3 months.

## 2019-11-08 ENCOUNTER — Ambulatory Visit: Admitting: Physician Assistant

## 2019-11-16 DIAGNOSIS — Z1231 Encounter for screening mammogram for malignant neoplasm of breast: Secondary | ICD-10-CM | POA: Diagnosis not present

## 2019-11-16 LAB — HM MAMMOGRAPHY

## 2020-01-03 ENCOUNTER — Ambulatory Visit (INDEPENDENT_AMBULATORY_CARE_PROVIDER_SITE_OTHER): Payer: BC Managed Care – PPO | Admitting: Physician Assistant

## 2020-01-03 ENCOUNTER — Encounter: Payer: Self-pay | Admitting: Physician Assistant

## 2020-01-03 ENCOUNTER — Other Ambulatory Visit: Payer: Self-pay

## 2020-01-03 VITALS — BP 113/68 | HR 80 | Ht 65.0 in | Wt 195.0 lb

## 2020-01-03 DIAGNOSIS — I10 Essential (primary) hypertension: Secondary | ICD-10-CM

## 2020-01-03 DIAGNOSIS — E785 Hyperlipidemia, unspecified: Secondary | ICD-10-CM | POA: Diagnosis not present

## 2020-01-03 DIAGNOSIS — Z23 Encounter for immunization: Secondary | ICD-10-CM | POA: Diagnosis not present

## 2020-01-03 DIAGNOSIS — E119 Type 2 diabetes mellitus without complications: Secondary | ICD-10-CM

## 2020-01-03 DIAGNOSIS — Z6831 Body mass index (BMI) 31.0-31.9, adult: Secondary | ICD-10-CM

## 2020-01-03 DIAGNOSIS — E6609 Other obesity due to excess calories: Secondary | ICD-10-CM

## 2020-01-03 LAB — POCT GLYCOSYLATED HEMOGLOBIN (HGB A1C): Hemoglobin A1C: 6.6 % — AB (ref 4.0–5.6)

## 2020-01-03 MED ORDER — FARXIGA 10 MG PO TABS: ORAL_TABLET | ORAL | 1 refills | Status: DC

## 2020-01-03 MED ORDER — PIOGLITAZONE HCL 30 MG PO TABS: 30.0000 mg | ORAL_TABLET | Freq: Every day | ORAL | 1 refills | Status: DC

## 2020-01-03 NOTE — Patient Instructions (Signed)

## 2020-01-04 LAB — COMPLETE METABOLIC PANEL WITH GFR
AG Ratio: 1.6 (calc) (ref 1.0–2.5)
ALT: 12 U/L (ref 6–29)
AST: 14 U/L (ref 10–35)
Albumin: 4.2 g/dL (ref 3.6–5.1)
Alkaline phosphatase (APISO): 60 U/L (ref 37–153)
BUN: 15 mg/dL (ref 7–25)
CO2: 28 mmol/L (ref 20–32)
Calcium: 9.6 mg/dL (ref 8.6–10.4)
Chloride: 107 mmol/L (ref 98–110)
Creat: 1.01 mg/dL (ref 0.50–1.05)
GFR, Est African American: 71 mL/min/{1.73_m2} (ref >=60)
GFR, Est Non African American: 61 mL/min/{1.73_m2} (ref >=60)
Globulin: 2.7 g/dL (calc) (ref 1.9–3.7)
Glucose, Bld: 114 mg/dL — ABNORMAL HIGH (ref 65–99)
Potassium: 4.3 mmol/L (ref 3.5–5.3)
Sodium: 142 mmol/L (ref 135–146)
Total Bilirubin: 0.7 mg/dL (ref 0.2–1.2)
Total Protein: 6.9 g/dL (ref 6.1–8.1)

## 2020-01-04 LAB — LIPID PANEL W/REFLEX DIRECT LDL
Cholesterol: 195 mg/dL (ref <200)
HDL: 58 mg/dL (ref >=50)
LDL Cholesterol (Calc): 119 mg/dL (calc) — ABNORMAL HIGH
Non-HDL Cholesterol (Calc): 137 mg/dL (calc) — ABNORMAL HIGH (ref <130)
Total CHOL/HDL Ratio: 3.4 (calc) (ref <5.0)
Triglycerides: 79 mg/dL (ref <150)

## 2020-01-05 ENCOUNTER — Encounter: Payer: Self-pay | Admitting: Physician Assistant

## 2020-01-05 NOTE — Progress Notes (Signed)
Subjective:    Patient ID: Tammy Bray, female    DOB: 08-Sep-1960, 59 y.o.   MRN: [id number]  HPI  Patient is a 59 year old female with type 2 diabetes, hypertension, hyperlipidemia who presents to the clinic for 63-month follow-up.  Patient is doing well on her current medication dose. She denies any concerns or complaints. She denies any hypoglycemia. She denies any open sores or wounds. She is not exercising but she is trying to watch her sugars and carbs. She denies any chest pain, palpitations, headaches or vision changes.  .. Active Ambulatory Problems    Diagnosis Date Noted  . Diabetes mellitus, type 2 (HCC) 01/13/2013  . Essential hypertension, benign 01/13/2013  . Hyperlipidemia 01/13/2013  . Right knee pain 01/13/2013  . IT band syndrome 01/13/2013  . Right hip pain 01/13/2013  . Tachycardia 11/25/2014  . Abdominal pain 02/05/2016  . Controlled type 2 diabetes mellitus without complication, without long-term current use of insulin (HCC) 08/26/2016  . Hematuria 01/15/2017  . Vaginal discharge 06/06/2018  . Class 1 obesity due to excess calories with serious comorbidity and body mass index (BMI) of 31.0 to 31.9 in adult 12/06/2018   Resolved Ambulatory Problems    Diagnosis Date Noted  . Uncontrolled type 2 diabetes mellitus with complication, without long-term current use of insulin (HCC) 06/14/2015   Past Medical History:  Diagnosis Date  . Diabetes mellitus without complication (HCC)   . Hypertension       Review of Systems  All other systems reviewed and are negative.      Objective:   Physical Exam Vitals reviewed.  Constitutional:      Appearance: Normal appearance. She is obese.  Cardiovascular:     Rate and Rhythm: Normal rate and regular rhythm.     Pulses: Normal pulses.     Heart sounds: Normal heart sounds.  Pulmonary:     Effort: Pulmonary effort is normal.     Breath sounds: Normal breath sounds.  Neurological:     General: No focal  deficit present.     Mental Status: She is alert and oriented to person, place, and time.  Psychiatric:        Mood and Affect: Mood normal.           Assessment & Plan:  [name] KitchenMarland KitchenBernece was seen today for diabetes.  Diagnoses and all orders for this visit:  Controlled type 2 diabetes mellitus without complication, without long-term current use of insulin (HCC) -     POCT glycosylated hemoglobin (Hb A1C) -     FARXIGA 10 MG TABS tablet; TAKE 1 TABLET DAILY. -     pioglitazone (ACTOS) 30 MG tablet; Take 1 tablet (30 mg total) by mouth daily. -     COMPLETE METABOLIC PANEL WITH GFR  Flu vaccine need -     Flu Vaccine QUAD 36+ mos IM  Hyperlipidemia LDL goal <70 -     Lipid Panel w/reflex Direct LDL  Essential hypertension, benign  Class 1 obesity due to excess calories with serious comorbidity and body mass index (BMI) of 31.0 to 31.9 in adult   .[name] Kitchen Lab Results  Component Value Date   HGBA1C 6.6 (A) 01/03/2020   A1c controlled. Stay on same medications BP to goal on ACE On statin will recheck lipid level today Patient reports scheduled diabetic eye exam Foot exam up-to-date Flu shot given today Covid and pneumonia shots up-to-date Follow-up in 3 months  .[name] KitchenDiscussed low carb diet with 1500 calories and 80g  of protein.  Exercising at least 150 minutes a week.  [employer] could be a Chief Technology Officer.

## 2020-01-05 NOTE — Progress Notes (Signed)
Tammy Bray,   LDL still not to goal of under 70. This goal is to help prevent heart attack or stroke. It was this elevated 1 year ago as well. We really need to get this closer to goal. You are already maxed out of cholesterol medication, crestor. What are you thoughts about adding a shot you give yourself every 2 weeks to help lower cholesterol?

## 2020-01-10 ENCOUNTER — Telehealth: Payer: Self-pay

## 2020-01-10 NOTE — Telephone Encounter (Signed)
PA for [name] approved.

## 2020-04-05 ENCOUNTER — Ambulatory Visit (INDEPENDENT_AMBULATORY_CARE_PROVIDER_SITE_OTHER): Payer: BC Managed Care – PPO | Admitting: Physician Assistant

## 2020-04-05 DIAGNOSIS — Z5329 Procedure and treatment not carried out because of patient's decision for other reasons: Secondary | ICD-10-CM

## 2020-04-05 NOTE — Progress Notes (Signed)
No show due to weather/no charge

## 2020-04-15 ENCOUNTER — Ambulatory Visit (INDEPENDENT_AMBULATORY_CARE_PROVIDER_SITE_OTHER): Payer: BC Managed Care – PPO | Admitting: Physician Assistant

## 2020-04-15 ENCOUNTER — Encounter: Payer: Self-pay | Admitting: Physician Assistant

## 2020-04-15 ENCOUNTER — Other Ambulatory Visit: Payer: Self-pay

## 2020-04-15 VITALS — BP 106/67 | HR 67 | Ht 65.0 in | Wt 196.0 lb

## 2020-04-15 DIAGNOSIS — E785 Hyperlipidemia, unspecified: Secondary | ICD-10-CM | POA: Diagnosis not present

## 2020-04-15 DIAGNOSIS — E119 Type 2 diabetes mellitus without complications: Secondary | ICD-10-CM

## 2020-04-15 DIAGNOSIS — I10 Essential (primary) hypertension: Secondary | ICD-10-CM

## 2020-04-15 LAB — POCT GLYCOSYLATED HEMOGLOBIN (HGB A1C): Hemoglobin A1C: 6.8 % — AB (ref 4.0–5.6)

## 2020-04-15 MED ORDER — FARXIGA 10 MG PO TABS: ORAL_TABLET | ORAL | 1 refills | Status: DC

## 2020-04-15 MED ORDER — PIOGLITAZONE HCL 30 MG PO TABS: 30.0000 mg | ORAL_TABLET | Freq: Every day | ORAL | 1 refills | Status: DC

## 2020-04-15 NOTE — Progress Notes (Signed)
Subjective:    Patient ID: Tammy Bray, female    DOB: 1961/01/11, 60 y.o.   MRN: [id number]  HPI  Patient is a 60 year old female with type 2 diabetes, hyperlipidemia, hypertension who presents to the clinic for 46-month follow-up.  Patient is very compliant with her diabetic medication.  She denies any hypoglycemic events.  She does not check her sugars regularly.  She denies any chest pain, palpitations, headaches, vision changes.  She denies any open sores or wounds.  She admits she has been eating more sugars and carbs over the last 3 months with hollowing, Thanksgiving, Christmas.   .. Active Ambulatory Problems    Diagnosis Date Noted  . Diabetes mellitus, type 2 (HCC) 01/13/2013  . Essential hypertension, benign 01/13/2013  . Hyperlipidemia LDL goal <70 01/13/2013  . Right knee pain 01/13/2013  . IT band syndrome 01/13/2013  . Right hip pain 01/13/2013  . Tachycardia 11/25/2014  . Abdominal pain 02/05/2016  . Controlled type 2 diabetes mellitus without complication, without long-term current use of insulin (HCC) 08/26/2016  . Hematuria 01/15/2017  . Vaginal discharge 06/06/2018  . Class 1 obesity due to excess calories with serious comorbidity and body mass index (BMI) of 31.0 to 31.9 in adult 12/06/2018   Resolved Ambulatory Problems    Diagnosis Date Noted  . Uncontrolled type 2 diabetes mellitus with complication, without long-term current use of insulin (HCC) 06/14/2015   Past Medical History:  Diagnosis Date  . Diabetes mellitus without complication (HCC)   . Hypertension     Review of Systems  All other systems reviewed and are negative.      Objective:   Physical Exam Vitals reviewed.  Constitutional:      Appearance: Normal appearance.  HENT:     Head: Normocephalic.  Cardiovascular:     Rate and Rhythm: Normal rate and regular rhythm.     Pulses: Normal pulses.     Heart sounds: No murmur heard.   Pulmonary:     Effort: Pulmonary effort is  normal.     Breath sounds: Normal breath sounds.  Lymphadenopathy:     Cervical: No cervical adenopathy.  Neurological:     General: No focal deficit present.     Mental Status: She is alert and oriented to person, place, and time.  Psychiatric:        Mood and Affect: Mood normal.           Assessment & Plan:  [name] KitchenMarland KitchenMakaleigh was seen today for diabetes.  Diagnoses and all orders for this visit:  Controlled type 2 diabetes mellitus without complication, without long-term current use of insulin (HCC) -     POCT glycosylated hemoglobin (Hb A1C) -     pioglitazone (ACTOS) 30 MG tablet; Take 1 tablet (30 mg total) by mouth daily. -     FARXIGA 10 MG TABS tablet; TAKE 1 TABLET DAILY.  Essential hypertension, benign  Hyperlipidemia LDL goal <70   A1C is under 7 but up from last visit  Continue same medications.  Discussed DM diet.  On ACE. BP to goal.  On statin. Labs up to date.  Eye exam just done. Will get records.  Foot exam UTD.  Flu and pneumonia done.  Pt declines covid vaccine.  Follow up in 3 months.

## 2020-04-15 NOTE — Patient Instructions (Signed)
Diabetes Mellitus and Nutrition, Adult When you have diabetes, or diabetes mellitus, it is very important to have healthy eating habits because your blood sugar (glucose) levels are greatly affected by what you eat and drink. Eating healthy foods in the right amounts, at about the same times every day, can help you:  Control your blood glucose.  Lower your risk of heart disease.  Improve your blood pressure.  Reach or maintain a healthy weight. What can affect my meal plan? Every person with diabetes is different, and each person has different needs for a meal plan. Your health care provider may recommend that you work with a dietitian to make a meal plan that is best for you. Your meal plan may vary depending on factors such as:  The calories you need.  The medicines you take.  Your weight.  Your blood glucose, blood pressure, and cholesterol levels.  Your activity level.  Other health conditions you have, such as heart or kidney disease. How do carbohydrates affect me? Carbohydrates, also called carbs, affect your blood glucose level more than any other type of food. Eating carbs naturally raises the amount of glucose in your blood. Carb counting is a method for keeping track of how many carbs you eat. Counting carbs is important to keep your blood glucose at a healthy level, especially if you use insulin or take certain oral diabetes medicines. It is important to know how many carbs you can safely have in each meal. This is different for every person. Your dietitian can help you calculate how many carbs you should have at each meal and for each snack. How does alcohol affect me? Alcohol can cause a sudden decrease in blood glucose (hypoglycemia), especially if you use insulin or take certain oral diabetes medicines. Hypoglycemia can be a life-threatening condition. Symptoms of hypoglycemia, such as sleepiness, dizziness, and confusion, are similar to symptoms of having too much  alcohol.  Do not drink alcohol if: ? Your health care provider tells you not to drink. ? You are pregnant, may be pregnant, or are planning to become pregnant.  If you drink alcohol: ? Do not drink on an empty stomach. ? Limit how much you use to:  0-1 drink a day for women.  0-2 drinks a day for men. ? Be aware of how much alcohol is in your drink. In the [place] one drink equals one 12 oz bottle of beer (355 mL), one 5 oz glass of wine (148 mL), or one 1 oz glass of hard liquor (44 mL). ? Keep yourself hydrated with water, diet soda, or unsweetened iced tea.  Keep in mind that regular soda, juice, and other mixers may contain a lot of sugar and must be counted as carbs. What are tips for following this plan? Reading food labels  Start by checking the serving size on the "Nutrition Facts" label of packaged foods and drinks. The amount of calories, carbs, fats, and other nutrients listed on the label is based on one serving of the item. Many items contain more than one serving per package.  Check the total grams (g) of carbs in one serving. You can calculate the number of servings of carbs in one serving by dividing the total carbs by 15. For example, if a food has 30 g of total carbs per serving, it would be equal to 2 servings of carbs.  Check the number of grams (g) of saturated fats and trans fats in one serving. Choose foods that have   a low amount or none of these fats.  Check the number of milligrams (mg) of salt (sodium) in one serving. Most people should limit total sodium intake to less than 2,300 mg per day.  Always check the nutrition information of foods labeled as "low-fat" or "nonfat." These foods may be higher in added sugar or refined carbs and should be avoided.  Talk to your dietitian to identify your daily goals for nutrients listed on the label. Shopping  Avoid buying canned, pre-made, or processed foods. These foods tend to be high in fat, sodium, and added  sugar.  Shop around the outside edge of the grocery store. This is where you will most often find fresh fruits and vegetables, bulk grains, fresh meats, and fresh dairy. Cooking  Use low-heat cooking methods, such as baking, instead of high-heat cooking methods like deep frying.  Cook using healthy oils, such as olive, canola, or sunflower oil.  Avoid cooking with butter, cream, or high-fat meats. Meal planning  Eat meals and snacks regularly, preferably at the same times every day. Avoid going long periods of time without eating.  Eat foods that are high in fiber, such as fresh fruits, vegetables, beans, and whole grains. Talk with your dietitian about how many servings of carbs you can eat at each meal.  Eat 4-6 oz (112-168 g) of lean protein each day, such as lean meat, chicken, fish, eggs, or tofu. One ounce (oz) of lean protein is equal to: ? 1 oz (28 g) of meat, chicken, or fish. ? 1 egg. ?  cup (62 g) of tofu.  Eat some foods each day that contain healthy fats, such as avocado, nuts, seeds, and fish.   What foods should I eat? Fruits Berries. Apples. Oranges. Peaches. Apricots. Plums. Grapes. Mango. Papaya. Pomegranate. Kiwi. Cherries. Vegetables Lettuce. Spinach. Leafy greens, including kale, chard, collard greens, and mustard greens. Beets. Cauliflower. Cabbage. Broccoli. Carrots. Green beans. Tomatoes. Peppers. Onions. Cucumbers. Brussels sprouts. Grains Whole grains, such as whole-wheat or whole-grain bread, crackers, tortillas, cereal, and pasta. Unsweetened oatmeal. Quinoa. Brown or wild rice. Meats and other proteins Seafood. Poultry without skin. Lean cuts of poultry and beef. Tofu. Nuts. Seeds. Dairy Low-fat or fat-free dairy products such as milk, yogurt, and cheese. The items listed above may not be a complete list of foods and beverages you can eat. Contact a dietitian for more information. What foods should I avoid? Fruits Fruits canned with  syrup. Vegetables Canned vegetables. Frozen vegetables with butter or cream sauce. Grains Refined white flour and flour products such as bread, pasta, snack foods, and cereals. Avoid all processed foods. Meats and other proteins Fatty cuts of meat. Poultry with skin. Breaded or fried meats. Processed meat. Avoid saturated fats. Dairy Full-fat yogurt, cheese, or milk. Beverages Sweetened drinks, such as soda or iced tea. The items listed above may not be a complete list of foods and beverages you should avoid. Contact a dietitian for more information. Questions to ask a health care provider  Do I need to meet with a diabetes educator?  Do I need to meet with a dietitian?  What number can I call if I have questions?  When are the best times to check my blood glucose? Where to find more information:  American Diabetes Association: diabetes.org  Academy of Nutrition and Dietetics: www.eatright.org  National Institute of Diabetes and Digestive and Kidney Diseases: www.niddk.nih.gov  Association of Diabetes Care and Education Specialists: www.diabeteseducator.org Summary  It is important to have healthy eating   habits because your blood sugar (glucose) levels are greatly affected by what you eat and drink.  A healthy meal plan will help you control your blood glucose and maintain a healthy lifestyle.  Your health care provider may recommend that you work with a dietitian to make a meal plan that is best for you.  Keep in mind that carbohydrates (carbs) and alcohol have immediate effects on your blood glucose levels. It is important to count carbs and to use alcohol carefully. This information is not intended to replace advice given to you by your health care provider. Make sure you discuss any questions you have with your health care provider. Document Revised: 02/07/2019 Document Reviewed: 02/07/2019 Elsevier Patient Education  2021 Elsevier Inc.  

## 2020-04-18 NOTE — Telephone Encounter (Signed)
Approvedon January 10, 2020 [id number];FWYOVZ:CHYIFOYD;Review Type:Prior Auth;Coverage Start Date:12/11/2019;Coverage End Date:01/09/2021

## 2020-05-14 ENCOUNTER — Other Ambulatory Visit: Payer: Self-pay | Admitting: Physician Assistant

## 2020-05-14 DIAGNOSIS — E119 Type 2 diabetes mellitus without complications: Secondary | ICD-10-CM

## 2020-07-12 ENCOUNTER — Ambulatory Visit: Payer: BC Managed Care – PPO | Admitting: Physician Assistant

## 2020-07-15 ENCOUNTER — Other Ambulatory Visit: Payer: Self-pay | Admitting: Physician Assistant

## 2020-07-15 DIAGNOSIS — E785 Hyperlipidemia, unspecified: Secondary | ICD-10-CM

## 2020-09-09 ENCOUNTER — Other Ambulatory Visit: Payer: Self-pay | Admitting: Physician Assistant

## 2020-09-09 DIAGNOSIS — E119 Type 2 diabetes mellitus without complications: Secondary | ICD-10-CM

## 2020-09-09 DIAGNOSIS — I10 Essential (primary) hypertension: Secondary | ICD-10-CM

## 2020-09-23 ENCOUNTER — Encounter: Payer: Self-pay | Admitting: Physician Assistant

## 2020-09-23 ENCOUNTER — Ambulatory Visit: Payer: BC Managed Care – PPO | Admitting: Physician Assistant

## 2020-09-23 ENCOUNTER — Other Ambulatory Visit: Payer: Self-pay

## 2020-09-23 VITALS — BP 110/65 | HR 64 | Ht 65.0 in | Wt 195.0 lb

## 2020-09-23 DIAGNOSIS — E6609 Other obesity due to excess calories: Secondary | ICD-10-CM

## 2020-09-23 DIAGNOSIS — I10 Essential (primary) hypertension: Secondary | ICD-10-CM

## 2020-09-23 DIAGNOSIS — E785 Hyperlipidemia, unspecified: Secondary | ICD-10-CM | POA: Diagnosis not present

## 2020-09-23 DIAGNOSIS — Z6832 Body mass index (BMI) 32.0-32.9, adult: Secondary | ICD-10-CM

## 2020-09-23 DIAGNOSIS — E119 Type 2 diabetes mellitus without complications: Secondary | ICD-10-CM

## 2020-09-23 LAB — POCT GLYCOSYLATED HEMOGLOBIN (HGB A1C): Hemoglobin A1C: 6.7 % — AB (ref 4.0–5.6)

## 2020-09-23 MED ORDER — PIOGLITAZONE HCL 30 MG PO TABS: 30.0000 mg | ORAL_TABLET | Freq: Every day | ORAL | 1 refills | Status: DC

## 2020-09-23 MED ORDER — FARXIGA 10 MG PO TABS: ORAL_TABLET | ORAL | 1 refills | Status: DC

## 2020-09-23 MED ORDER — LISINOPRIL 10 MG PO TABS: ORAL_TABLET | ORAL | 1 refills | Status: DC

## 2020-09-23 NOTE — Progress Notes (Addendum)
Subjective:    Patient ID: Tammy Bray, female    DOB: 1960-07-28, 60 y.o.   MRN: [id number]  HPI Patient is a 60 year old female with type 2 diabetes, hypertension, hyperlipidemia who presents to the clinic for 5-month follow-up.  Patient is doing well with medication for diabetes.  She is not checking her sugars.  She denies any hypoglycemic events.  She denies any open sores or wounds.  She denies any chest pain, palpitations, headaches or vision changes.  She did eventually increase her Crestor but finished her other dose before she increase.  She has not been on the increased dose very long.  She is tolerating it well.  .. Active Ambulatory Problems    Diagnosis Date Noted   Diabetes mellitus, type 2 (HCC) 01/13/2013   Essential hypertension, benign 01/13/2013   Dyslipidemia, goal LDL below 70 01/13/2013   Right knee pain 01/13/2013   IT band syndrome 01/13/2013   Right hip pain 01/13/2013   Tachycardia 11/25/2014   Abdominal pain 02/05/2016   Controlled type 2 diabetes mellitus without complication, without long-term current use of insulin (HCC) 08/26/2016   Hematuria 01/15/2017   Vaginal discharge 06/06/2018   Class 1 obesity due to excess calories with serious comorbidity and body mass index (BMI) of 32.0 to 32.9 in adult 12/06/2018   Resolved Ambulatory Problems    Diagnosis Date Noted   Uncontrolled type 2 diabetes mellitus with complication, without long-term current use of insulin (HCC) 06/14/2015   Past Medical History:  Diagnosis Date   Diabetes mellitus without complication (HCC)    Hypertension        Review of Systems  All other systems reviewed and are negative.     Objective:   Physical Exam Vitals reviewed.  Constitutional:      Appearance: Normal appearance. She is obese.  HENT:     Head: Normocephalic.  Neck:     Vascular: No carotid bruit.  Cardiovascular:     Rate and Rhythm: Normal rate and regular rhythm.     Pulses: Normal pulses.      Heart sounds: Normal heart sounds.  Pulmonary:     Effort: Pulmonary effort is normal.     Breath sounds: Normal breath sounds.  Neurological:     General: No focal deficit present.     Mental Status: She is alert and oriented to person, place, and time.  Psychiatric:        Mood and Affect: Mood normal.   .. Results for orders placed or performed in visit on 09/23/20  POCT glycosylated hemoglobin (Hb A1C)  Result Value Ref Range   Hemoglobin A1C 6.7 (A) 4.0 - 5.6 %   HbA1c POC (<> result, manual entry)     HbA1c, POC (prediabetic range)     HbA1c, POC (controlled diabetic range)             Assessment & Plan:  [name] KitchenMarland KitchenConna was seen today for diabetes.  Diagnoses and all orders for this visit:  Controlled type 2 diabetes mellitus without complication, without long-term current use of insulin (HCC) -     POCT glycosylated hemoglobin (Hb A1C) -     COMPLETE METABOLIC PANEL WITH GFR -     lisinopril (ZESTRIL) 10 MG tablet; TAKE 1 TABLET DAILY. -     pioglitazone (ACTOS) 30 MG tablet; Take 1 tablet (30 mg total) by mouth daily. -     FARXIGA 10 MG TABS tablet; TAKE 1 TABLET DAILY.  Essential hypertension, benign -  lisinopril (ZESTRIL) 10 MG tablet; TAKE 1 TABLET DAILY.  Dyslipidemia, goal LDL below 70  Class 1 obesity due to excess calories with serious comorbidity and body mass index (BMI) of 32.0 to 32.9 in adult  A1C to goal.  Continue on same medications.  Continue to work on weight loss.  On ACE BP to goal. Cmp ordered for management.  On statin. Last LDL not to goal. Increased crestor and not checked yet.  Foot and eye exam UTD.  Covid vaccine x2 done. Needs booster.  Pneumonia UTD.  Follow up in 6 months.

## 2020-09-24 LAB — COMPLETE METABOLIC PANEL WITH GFR
AG Ratio: 1.4 (calc) (ref 1.0–2.5)
ALT: 12 U/L (ref 6–29)
AST: 14 U/L (ref 10–35)
Albumin: 4.2 g/dL (ref 3.6–5.1)
Alkaline phosphatase (APISO): 66 U/L (ref 37–153)
BUN: 15 mg/dL (ref 7–25)
CO2: 29 mmol/L (ref 20–32)
Calcium: 9.7 mg/dL (ref 8.6–10.4)
Chloride: 108 mmol/L (ref 98–110)
Creat: 0.93 mg/dL (ref 0.50–1.05)
Globulin: 2.9 g/dL (calc) (ref 1.9–3.7)
Glucose, Bld: 107 mg/dL — ABNORMAL HIGH (ref 65–99)
Potassium: 4.3 mmol/L (ref 3.5–5.3)
Sodium: 142 mmol/L (ref 135–146)
Total Bilirubin: 0.5 mg/dL (ref 0.2–1.2)
Total Protein: 7.1 g/dL (ref 6.1–8.1)
eGFR: 70 mL/min/{1.73_m2} (ref >=60)

## 2020-09-25 NOTE — Progress Notes (Signed)
Weltha,   Kidney, liver, glucose look great!

## 2021-01-13 ENCOUNTER — Other Ambulatory Visit: Payer: Self-pay | Admitting: Physician Assistant

## 2021-01-13 DIAGNOSIS — E785 Hyperlipidemia, unspecified: Secondary | ICD-10-CM

## 2021-01-28 ENCOUNTER — Other Ambulatory Visit: Payer: Self-pay

## 2021-01-28 ENCOUNTER — Ambulatory Visit (INDEPENDENT_AMBULATORY_CARE_PROVIDER_SITE_OTHER): Payer: BC Managed Care – PPO | Admitting: Physician Assistant

## 2021-01-28 DIAGNOSIS — Z23 Encounter for immunization: Secondary | ICD-10-CM | POA: Diagnosis not present

## 2021-03-26 ENCOUNTER — Ambulatory Visit (INDEPENDENT_AMBULATORY_CARE_PROVIDER_SITE_OTHER): Payer: BC Managed Care – PPO | Admitting: Physician Assistant

## 2021-03-26 DIAGNOSIS — Z91199 Patient's noncompliance with other medical treatment and regimen due to unspecified reason: Secondary | ICD-10-CM

## 2021-03-26 DIAGNOSIS — I10 Essential (primary) hypertension: Secondary | ICD-10-CM

## 2021-03-26 DIAGNOSIS — E785 Hyperlipidemia, unspecified: Secondary | ICD-10-CM

## 2021-03-26 DIAGNOSIS — E119 Type 2 diabetes mellitus without complications: Secondary | ICD-10-CM

## 2021-03-26 NOTE — Progress Notes (Signed)
No show

## 2021-04-14 ENCOUNTER — Ambulatory Visit (INDEPENDENT_AMBULATORY_CARE_PROVIDER_SITE_OTHER): Payer: BC Managed Care – PPO

## 2021-04-14 ENCOUNTER — Ambulatory Visit (INDEPENDENT_AMBULATORY_CARE_PROVIDER_SITE_OTHER): Payer: BC Managed Care – PPO | Admitting: Physician Assistant

## 2021-04-14 ENCOUNTER — Other Ambulatory Visit: Payer: Self-pay

## 2021-04-14 VITALS — BP 108/72 | HR 69 | Ht 65.0 in | Wt 198.0 lb

## 2021-04-14 DIAGNOSIS — E119 Type 2 diabetes mellitus without complications: Secondary | ICD-10-CM

## 2021-04-14 DIAGNOSIS — R0989 Other specified symptoms and signs involving the circulatory and respiratory systems: Secondary | ICD-10-CM

## 2021-04-14 DIAGNOSIS — Z1329 Encounter for screening for other suspected endocrine disorder: Secondary | ICD-10-CM

## 2021-04-14 DIAGNOSIS — Z23 Encounter for immunization: Secondary | ICD-10-CM | POA: Diagnosis not present

## 2021-04-14 DIAGNOSIS — Z79899 Other long term (current) drug therapy: Secondary | ICD-10-CM | POA: Diagnosis not present

## 2021-04-14 DIAGNOSIS — I10 Essential (primary) hypertension: Secondary | ICD-10-CM

## 2021-04-14 DIAGNOSIS — Z1231 Encounter for screening mammogram for malignant neoplasm of breast: Secondary | ICD-10-CM | POA: Diagnosis not present

## 2021-04-14 DIAGNOSIS — M79674 Pain in right toe(s): Secondary | ICD-10-CM

## 2021-04-14 DIAGNOSIS — E785 Hyperlipidemia, unspecified: Secondary | ICD-10-CM

## 2021-04-14 DIAGNOSIS — Z1211 Encounter for screening for malignant neoplasm of colon: Secondary | ICD-10-CM

## 2021-04-14 LAB — HM MAMMOGRAPHY

## 2021-04-14 LAB — POCT GLYCOSYLATED HEMOGLOBIN (HGB A1C): Hemoglobin A1C: 7 % — AB (ref 4.0–5.6)

## 2021-04-14 MED ORDER — LISINOPRIL 10 MG PO TABS: ORAL_TABLET | ORAL | 1 refills | Status: DC

## 2021-04-14 MED ORDER — PIOGLITAZONE HCL 30 MG PO TABS: 30.0000 mg | ORAL_TABLET | Freq: Every day | ORAL | 1 refills | Status: DC

## 2021-04-14 MED ORDER — ROSUVASTATIN CALCIUM 40 MG PO TABS: 40.0000 mg | ORAL_TABLET | Freq: Every day | ORAL | 3 refills | Status: DC

## 2021-04-14 MED ORDER — FARXIGA 10 MG PO TABS: ORAL_TABLET | ORAL | 1 refills | Status: DC

## 2021-04-14 NOTE — Patient Instructions (Addendum)
Follow up in 3 months since A1C increased. Make sure walking 150 minutes a week and keeping low sugar/carb diet.   Will call with carotid dopplers.

## 2021-04-14 NOTE — Progress Notes (Signed)
Subjective:     Patient ID: Tammy Bray, female   DOB: 04/24/1960, 61 y.o.   MRN: [id number]  HPI 61 YO female presents to the clinic for a diabetic follow up. Last a1c 6.7 in July. A1c today 7.0. Patient has been taking farxiga and Actos regularly, she denies any adverse reactions or side effects from the medication.pt is not checking her sugars. Patient has a diabetic eye exam scheduled for March. She does report she needs to eat better and exercise more to get her a1c back down. Denies CP, shortness of breath, headache. Denies any open sores or wounds. No hypoglycemic events. No CP, palpitations, headaches or vision changes.   Patient also reports that her 2nd and 3rd toe on her left foot hurt mostly at night. She does not have any difficulty walking but an aching feeling every night that started in October. No redness, warmth, swelling no known injury.   Patient wants to receive the tetanus shot and a referral for cologuard. Patient completed her mammogram today.  She refuses shingrix and covid booster.   .. Active Ambulatory Problems    Diagnosis Date Noted   Diabetes mellitus, type 2 (HCC) 01/13/2013   Essential hypertension, benign 01/13/2013   Dyslipidemia, goal LDL below 70 01/13/2013   Right knee pain 01/13/2013   IT band syndrome 01/13/2013   Right hip pain 01/13/2013   Tachycardia 11/25/2014   Abdominal pain 02/05/2016   Controlled type 2 diabetes mellitus without complication, without long-term current use of insulin (HCC) 08/26/2016   Hematuria 01/15/2017   Vaginal discharge 06/06/2018   Class 1 obesity due to excess calories with serious comorbidity and body mass index (BMI) of 32.0 to 32.9 in adult 12/06/2018   Left carotid bruit 04/14/2021   Toe pain, right 04/15/2021   Resolved Ambulatory Problems    Diagnosis Date Noted   Uncontrolled type 2 diabetes mellitus with complication, without long-term current use of insulin 06/14/2015   Past Medical History:   Diagnosis Date   Diabetes mellitus without complication (HCC)    Hypertension      Review of Systems  Respiratory:  Negative for chest tightness and shortness of breath.   Cardiovascular:  Negative for chest pain and palpitations.  Gastrointestinal:  Negative for abdominal pain, constipation, diarrhea, nausea and vomiting.  Genitourinary:  Negative for difficulty urinating, dysuria and frequency.  Musculoskeletal:        2nd and 3rd toe pain on the left foot  Skin: Negative.       Objective:   Physical Exam Constitutional:      General: She is not in acute distress.    Appearance: Normal appearance. She is obese. She is not ill-appearing, toxic-appearing or diaphoretic.  HENT:     Head: Normocephalic and atraumatic.  Eyes:     Conjunctiva/sclera: Conjunctivae normal.  Neck:     Vascular: Carotid bruit present.     Comments: Right sided pulsatile carotid  Cardiovascular:     Rate and Rhythm: Normal rate and regular rhythm.     Heart sounds: Normal heart sounds.  Pulmonary:     Effort: Pulmonary effort is normal.     Breath sounds: Normal breath sounds.  Abdominal:     General: Abdomen is flat. Bowel sounds are normal. There is no distension.     Palpations: Abdomen is soft.     Tenderness: There is no abdominal tenderness.  Musculoskeletal:        General: Normal range of motion.  Cervical back: Normal range of motion and neck supple.  Skin:    General: Skin is warm and dry.  Neurological:     Mental Status: She is alert and oriented to person, place, and time.  Psychiatric:        Mood and Affect: Mood normal.        Behavior: Behavior normal.        Thought Content: Thought content normal.        Judgment: Judgment normal.   Right 2nd and 3rd toes no swelling, warmth, redness or pain to palpation.   Today's Vitals   04/14/21 1405  BP: 108/72  Pulse: 69  SpO2: 99%  Weight: 198 lb 0.6 oz (89.8 kg)  Height: 5\' 5"  (1.651 m)   Body mass index is 32.96  kg/m.    .. Results for orders placed or performed in visit on 04/14/21  TSH  Result Value Ref Range   TSH 0.92 0.40 - 4.50 mIU/L  Lipid Panel w/reflex Direct LDL  Result Value Ref Range   Cholesterol 192 <200 mg/dL   HDL 66 > OR = 50 mg/dL   Triglycerides 69 <295 mg/dL   LDL Cholesterol (Calc) 110 (H) mg/dL (calc)   Total CHOL/HDL Ratio 2.9 <5.0 (calc)   Non-HDL Cholesterol (Calc) 126 <130 mg/dL (calc)  COMPLETE METABOLIC PANEL WITH GFR  Result Value Ref Range   Glucose, Bld 89 65 - 99 mg/dL   BUN 16 7 - 25 mg/dL   Creat 2.84 1.32 - 4.40 mg/dL   eGFR 65 > OR = 60 NU/UVO/5.36U4   BUN/Creatinine Ratio NOT APPLICABLE 6 - 22 (calc)   Sodium 141 135 - 146 mmol/L   Potassium 4.3 3.5 - 5.3 mmol/L   Chloride 106 98 - 110 mmol/L   CO2 28 20 - 32 mmol/L   Calcium 9.8 8.6 - 10.4 mg/dL   Total Protein 7.4 6.1 - 8.1 g/dL   Albumin 4.4 3.6 - 5.1 g/dL   Globulin 3.0 1.9 - 3.7 g/dL (calc)   AG Ratio 1.5 1.0 - 2.5 (calc)   Total Bilirubin 0.7 0.2 - 1.2 mg/dL   Alkaline phosphatase (APISO) 57 37 - 153 U/L   AST 16 10 - 35 U/L   ALT 14 6 - 29 U/L  CBC with Differential/Platelet  Result Value Ref Range   WBC 5.0 3.8 - 10.8 Thousand/uL   RBC 4.80 3.80 - 5.10 Million/uL   Hemoglobin 12.3 11.7 - 15.5 g/dL   HCT 40.3 47.4 - 25.9 %   MCV 80.8 80.0 - 100.0 fL   MCH 25.6 (L) 27.0 - 33.0 pg   MCHC 31.7 (L) 32.0 - 36.0 g/dL   RDW 56.3 87.5 - 64.3 %   Platelets 256 140 - 400 Thousand/uL   MPV 9.2 7.5 - 12.5 fL   Neutro Abs 2,125 1,500 - 7,800 cells/uL   Lymphs Abs 2,330 850 - 3,900 cells/uL   Absolute Monocytes 475 200 - 950 cells/uL   Eosinophils Absolute 50 15 - 500 cells/uL   Basophils Absolute 20 0 - 200 cells/uL   Neutrophils Relative % 42.5 %   Total Lymphocyte 46.6 %   Monocytes Relative 9.5 %   Eosinophils Relative 1.0 %   Basophils Relative 0.4 %  POCT glycosylated hemoglobin (Hb A1C)  Result Value Ref Range   Hemoglobin A1C 7.0 (A) 4.0 - 5.6 %   HbA1c POC (<> result, manual  entry)     HbA1c, POC (prediabetic range)  HbA1c, POC (controlled diabetic range)      Assessment:     Tammy KitchenMarland KitchenAkai was seen today for follow-up.  Diagnoses and all orders for this visit:  Controlled type 2 diabetes mellitus without complication, without long-term current use of insulin (HCC) -     POCT glycosylated hemoglobin (Hb A1C) -     COMPLETE METABOLIC PANEL WITH GFR -     FARXIGA 10 MG TABS tablet; TAKE 1 TABLET DAILY. -     lisinopril (ZESTRIL) 10 MG tablet; TAKE 1 TABLET DAILY. -     pioglitazone (ACTOS) 30 MG tablet; Take 1 tablet (30 mg total) by mouth daily.  Essential hypertension, benign -     COMPLETE METABOLIC PANEL WITH GFR -     lisinopril (ZESTRIL) 10 MG tablet; TAKE 1 TABLET DAILY.  Dyslipidemia, goal LDL below 70 -     Lipid Panel w/reflex Direct LDL -     rosuvastatin (CRESTOR) 40 MG tablet; Take 1 tablet (40 mg total) by mouth daily. NEEDS LABS  Thyroid disorder screen -     TSH  Medication management -     POCT glycosylated hemoglobin (Hb A1C) -     TSH -     Lipid Panel w/reflex Direct LDL -     COMPLETE METABOLIC PANEL WITH GFR -     CBC with Differential/Platelet  Colon cancer screening -     Cologuard  Right carotid bruit -     US Carotid Duplex Bilateral; Future  Need for Tdap vaccination -     Tdap vaccine greater than or equal to 7yo IM  Toe pain, right       Plan:     Obtain fasting labs today to monitor cholesterol, thyroid, and electrolytes.   Continue farxiga and Actos for diabetes control. Encouraged exercise and healthy eating. Recheck in 3 months to make sure A1C is improving. Please get eye exam. On ACE. BP to goal. On statin. Will check LDL. Declined shingrix/covid vaccine.   Obtain bilateral ultrasound of carotids to assess new possible right sided bruit.   Administer tdap.   Order cologuard.  No concerning features of toe pain. PE normal. Likely some arthritis. Given sample of pennsaid to rub before bed over  toes or as needed. Follow up if pain changes or worsens.   Follow up in 3 months.

## 2021-04-15 ENCOUNTER — Encounter: Payer: Self-pay | Admitting: Physician Assistant

## 2021-04-15 DIAGNOSIS — M79674 Pain in right toe(s): Secondary | ICD-10-CM | POA: Insufficient documentation

## 2021-04-15 LAB — COMPLETE METABOLIC PANEL WITH GFR
AG Ratio: 1.5 (calc) (ref 1.0–2.5)
ALT: 14 U/L (ref 6–29)
AST: 16 U/L (ref 10–35)
Albumin: 4.4 g/dL (ref 3.6–5.1)
Alkaline phosphatase (APISO): 57 U/L (ref 37–153)
BUN: 16 mg/dL (ref 7–25)
CO2: 28 mmol/L (ref 20–32)
Calcium: 9.8 mg/dL (ref 8.6–10.4)
Chloride: 106 mmol/L (ref 98–110)
Creat: 0.99 mg/dL (ref 0.50–1.05)
Globulin: 3 g/dL (calc) (ref 1.9–3.7)
Glucose, Bld: 89 mg/dL (ref 65–99)
Potassium: 4.3 mmol/L (ref 3.5–5.3)
Sodium: 141 mmol/L (ref 135–146)
Total Bilirubin: 0.7 mg/dL (ref 0.2–1.2)
Total Protein: 7.4 g/dL (ref 6.1–8.1)
eGFR: 65 mL/min/{1.73_m2} (ref >=60)

## 2021-04-15 LAB — LIPID PANEL W/REFLEX DIRECT LDL
Cholesterol: 192 mg/dL (ref <200)
HDL: 66 mg/dL (ref >=50)
LDL Cholesterol (Calc): 110 mg/dL (calc) — ABNORMAL HIGH
Non-HDL Cholesterol (Calc): 126 mg/dL (calc) (ref <130)
Total CHOL/HDL Ratio: 2.9 (calc) (ref <5.0)
Triglycerides: 69 mg/dL (ref <150)

## 2021-04-15 LAB — CBC WITH DIFFERENTIAL/PLATELET
Absolute Monocytes: 475 cells/uL (ref 200–950)
Basophils Absolute: 20 cells/uL (ref 0–200)
Basophils Relative: 0.4 %
Eosinophils Absolute: 50 cells/uL (ref 15–500)
Eosinophils Relative: 1 %
HCT: 38.8 % (ref 35.0–45.0)
Hemoglobin: 12.3 g/dL (ref 11.7–15.5)
Lymphs Abs: 2330 cells/uL (ref 850–3900)
MCH: 25.6 pg — ABNORMAL LOW (ref 27.0–33.0)
MCHC: 31.7 g/dL — ABNORMAL LOW (ref 32.0–36.0)
MCV: 80.8 fL (ref 80.0–100.0)
MPV: 9.2 fL (ref 7.5–12.5)
Monocytes Relative: 9.5 %
Neutro Abs: 2125 cells/uL (ref 1500–7800)
Neutrophils Relative %: 42.5 %
Platelets: 256 10*3/uL (ref 140–400)
RBC: 4.8 10*6/uL (ref 3.80–5.10)
RDW: 14.3 % (ref 11.0–15.0)
Total Lymphocyte: 46.6 %
WBC: 5 10*3/uL (ref 3.8–10.8)

## 2021-04-15 LAB — TSH: TSH: 0.92 mIU/L (ref 0.40–4.50)

## 2021-04-15 NOTE — Progress Notes (Signed)
GREAT news no significant plaque accumulation.

## 2021-04-15 NOTE — Progress Notes (Signed)
Thyroid looks great.  Kidney, liver, glucose looks great.  LDL still not to goal of 70 or under. If you are taking crestor 40mg  daily then we need to consider adding another medication to get you to goal.

## 2021-04-16 ENCOUNTER — Encounter: Payer: Self-pay | Admitting: Neurology

## 2021-04-16 MED ORDER — EZETIMIBE 10 MG PO TABS: 10.0000 mg | ORAL_TABLET | Freq: Every day | ORAL | 3 refills | Status: DC

## 2021-04-16 NOTE — Telephone Encounter (Signed)
LMOM letting patient know. 

## 2021-04-16 NOTE — Telephone Encounter (Signed)
Patient called back and discussed results of labs. She is okay changing or adding medication for cholesterol. Please advise what you want to do.

## 2021-04-16 NOTE — Addendum Note (Signed)
Addended by: Jomarie Longs on: 04/16/2021 03:43 PM   Modules accepted: Orders

## 2021-04-16 NOTE — Telephone Encounter (Signed)
Added zetia with crestor.

## 2021-04-22 ENCOUNTER — Telehealth: Payer: Self-pay | Admitting: Neurology

## 2021-04-22 DIAGNOSIS — I10 Essential (primary) hypertension: Secondary | ICD-10-CM

## 2021-04-22 DIAGNOSIS — E785 Hyperlipidemia, unspecified: Secondary | ICD-10-CM

## 2021-04-22 DIAGNOSIS — E119 Type 2 diabetes mellitus without complications: Secondary | ICD-10-CM

## 2021-04-22 MED ORDER — EZETIMIBE 10 MG PO TABS: 10.0000 mg | ORAL_TABLET | Freq: Every day | ORAL | 3 refills | Status: DC

## 2021-04-22 MED ORDER — ROSUVASTATIN CALCIUM 40 MG PO TABS: 40.0000 mg | ORAL_TABLET | Freq: Every day | ORAL | 3 refills | Status: DC

## 2021-04-22 MED ORDER — LISINOPRIL 10 MG PO TABS: ORAL_TABLET | ORAL | 1 refills | Status: DC

## 2021-04-22 MED ORDER — FARXIGA 10 MG PO TABS: ORAL_TABLET | ORAL | 1 refills | Status: DC

## 2021-04-22 MED ORDER — PIOGLITAZONE HCL 30 MG PO TABS: 30.0000 mg | ORAL_TABLET | Freq: Every day | ORAL | 1 refills | Status: DC

## 2021-04-22 NOTE — Telephone Encounter (Signed)
Patient left a vm stating all medications were cancelled at [facility] and she would like prescriptions sent to [facility] in [place]. All medications reordered.

## 2021-04-23 NOTE — Telephone Encounter (Signed)
Patient called back today and states she needs medications to [facility]. I called and LVM letting patient know she would need to call the pharmacy to transfer her prescriptions.

## 2021-05-16 ENCOUNTER — Encounter: Payer: Self-pay | Admitting: Physician Assistant

## 2021-05-17 DIAGNOSIS — R0602 Shortness of breath: Secondary | ICD-10-CM | POA: Diagnosis not present

## 2021-05-17 DIAGNOSIS — Z79899 Other long term (current) drug therapy: Secondary | ICD-10-CM | POA: Diagnosis not present

## 2021-05-17 DIAGNOSIS — I10 Essential (primary) hypertension: Secondary | ICD-10-CM | POA: Diagnosis not present

## 2021-05-17 DIAGNOSIS — R0789 Other chest pain: Secondary | ICD-10-CM | POA: Diagnosis not present

## 2021-05-17 DIAGNOSIS — R002 Palpitations: Secondary | ICD-10-CM | POA: Diagnosis not present

## 2021-05-17 DIAGNOSIS — E785 Hyperlipidemia, unspecified: Secondary | ICD-10-CM | POA: Diagnosis not present

## 2021-05-17 DIAGNOSIS — R000 Tachycardia, unspecified: Secondary | ICD-10-CM | POA: Diagnosis not present

## 2021-05-17 DIAGNOSIS — R079 Chest pain, unspecified: Secondary | ICD-10-CM | POA: Diagnosis not present

## 2021-05-17 DIAGNOSIS — Z7984 Long term (current) use of oral hypoglycemic drugs: Secondary | ICD-10-CM | POA: Diagnosis not present

## 2021-05-17 DIAGNOSIS — E1169 Type 2 diabetes mellitus with other specified complication: Secondary | ICD-10-CM | POA: Diagnosis not present

## 2021-05-17 DIAGNOSIS — R918 Other nonspecific abnormal finding of lung field: Secondary | ICD-10-CM | POA: Diagnosis not present

## 2021-05-19 ENCOUNTER — Encounter: Payer: Self-pay | Admitting: Physician Assistant

## 2021-05-19 ENCOUNTER — Other Ambulatory Visit: Payer: Self-pay

## 2021-05-19 ENCOUNTER — Ambulatory Visit (INDEPENDENT_AMBULATORY_CARE_PROVIDER_SITE_OTHER): Payer: BC Managed Care – PPO | Admitting: Physician Assistant

## 2021-05-19 VITALS — BP 138/78 | HR 90 | Ht 65.0 in | Wt 196.0 lb

## 2021-05-19 DIAGNOSIS — N281 Cyst of kidney, acquired: Secondary | ICD-10-CM

## 2021-05-19 DIAGNOSIS — R002 Palpitations: Secondary | ICD-10-CM

## 2021-05-19 NOTE — Patient Instructions (Addendum)
OTC iron supplement once with breakfast  ?Stop zetia  ?Follow up 1 month.  ? ?Premature Ventricular Contraction ?A premature ventricular contraction (PVC) is a common kind of irregular heartbeat (arrhythmia). These contractions are extra heartbeats that start in the ventricles of the heart and occur too early in the normal sequence. During the PVC, the heart's normal electrical pathway is not used, so the beat is shorter and less effective. In most cases, these contractions come and go and do not require treatment. ?What are the causes? ?Common causes of the condition include: ?Smoking. ?Drinking alcohol. ?Certain medicines. ?Some illegal drugs. ?Stress. ?Caffeine. ?Certain medical conditions can also cause PVCs: ?Heart failure. ?Heart attack, or coronary artery disease. ?Heart valve problems. ?Changes in minerals in the blood (electrolytes). ?Low blood oxygen levels or high carbon dioxide levels. ?In many cases, the cause of this condition is not known. ?What are the signs or symptoms? ?The main symptom of this condition is fast or skipped heartbeats (palpitations). Other symptoms include: ?Chest pain. ?Shortness of breath. ?Feeling tired. ?Dizziness. ?Difficulty exercising. ?In some cases, there are no symptoms. ?How is this diagnosed? ?This condition may be diagnosed based on: ?Your medical history. ?A physical exam. During the exam, the health care provider will check for irregular heartbeats. ?Tests, such as: ?An ECG (electrocardiogram) to monitor the electrical activity of your heart. ?An ambulatory cardiac monitor. This device records your heartbeats for 24 hours or more. ?Stress tests to see how exercise affects your heart rhythm and blood supply. ?An echocardiogram. This test uses sound waves (ultrasound) to produce an image of your heart. ?An electrophysiology study (EPS). This test checks for electrical problems in your heart. ?How is this treated? ?Treatment for this condition depends on any underlying  conditions, the type of PVCs that you are having, and how much the symptoms are interfering with your daily life. ?Possible treatments include: ?Avoiding things that cause premature contractions (triggers). These include caffeine and alcohol. ?Taking medicines if symptoms are severe or if the extra heartbeats are frequent. ?Getting treatment for underlying conditions that cause PVCs. ?Having an implantable cardioverter defibrillator (ICD), if you are at risk for a serious arrhythmia. The ICD is a small device that is inserted into your chest to monitor your heartbeat. When it senses an irregular heartbeat, it sends a shock to bring the heartbeat back to normal. ?Having a procedure to destroy the portion of the heart tissue that sends out abnormal signals (catheter ablation). ?In some cases, no treatment is required. ?Follow these instructions at home: ?Lifestyle ?Do not use any products that contain nicotine or tobacco, such as cigarettes, e-cigarettes, and chewing tobacco. If you need help quitting, ask your health care provider. ?Do not use illegal drugs. ?Exercise regularly. Ask your health care provider what type of exercise is safe for you. ?Try to get at least 7-9 hours of sleep each night, or as much as recommended by your health care provider. ?Find healthy ways to manage stress. Avoid stressful situations when possible. ?Alcohol use ?Do not drink alcohol if: ?Your health care provider tells you not to drink. ?You are pregnant, may be pregnant, or are planning to become pregnant. ?Alcohol triggers your episodes. ?If you drink alcohol: ?Limit how much you use to: ?0-1 drink a day for women. ?0-2 drinks a day for men. ?Be aware of how much alcohol is in your drink. In the [place] one drink equals one 12 oz bottle of beer (355 mL), one 5 oz glass of wine (148 mL), or  one 1? oz glass of hard liquor (44 mL). ?General instructions ?Take over-the-counter and prescription medicines only as told by your health care  provider. ?If caffeine triggers episodes of PVC, do not eat, drink, or use anything with caffeine in it. ?Keep all follow-up visits as told by your health care provider. This is important. ?Contact a health care provider if you: ?Feel palpitations. ?Get help right away if you: ?Have chest pain. ?Have shortness of breath. ?Have sweating for no reason. ?Have nausea and vomiting. ?Become light-headed or you faint. ?Summary ?A premature ventricular contraction (PVC) is a common kind of irregular heartbeat (arrhythmia). ?In most cases, these contractions come and go and do not require treatment. ?You may need to wear an ambulatory cardiac monitor. This records your heartbeats for 24 hours or more. ?Treatment depends on any underlying conditions, the type of PVCs that you are having, and how much the symptoms are interfering with your daily life. ?This information is not intended to replace advice given to you by your health care provider. Make sure you discuss any questions you have with your health care provider. ?Document Revised: 07/30/2020 Document Reviewed: 07/30/2020 ?Elsevier Patient Education ? [place]. ? ?

## 2021-05-19 NOTE — Progress Notes (Signed)
? ?  Subjective:  ? ? Patient ID: Tammy Bray, female    DOB: [birth year], 61 y.o.   MRN: [id number] ? ?HPI ?Pt is a 61 yo female with T2DM, HTN, HLD who presents to the clinic to follow up after ED visit for palpitations. She woke up one morning and drank a cup of coffee and started having palpitations and shortness of breath. She went to ED on 3/4 for evaluation. CTA was negative, EKG no significant findings, HgB 11.4, kidney and liver looked great, TSH 1.80, troponins normal, CXR WNL. No treatment given. She feels like better but she continues to have them. She did just start zetia for cholesterol.  ? ?.. ?Active Ambulatory Problems  ?  Diagnosis Date Noted  ? Diabetes mellitus, type 2 (HCC) 01/13/2013  ? Essential hypertension, benign 01/13/2013  ? Dyslipidemia, goal LDL below 70 01/13/2013  ? Right knee pain 01/13/2013  ? IT band syndrome 01/13/2013  ? Right hip pain 01/13/2013  ? Tachycardia 11/25/2014  ? Abdominal pain 02/05/2016  ? Controlled type 2 diabetes mellitus without complication, without long-term current use of insulin (HCC) 08/26/2016  ? Hematuria 01/15/2017  ? Vaginal discharge 06/06/2018  ? Class 1 obesity due to excess calories with serious comorbidity and body mass index (BMI) of 32.0 to 32.9 in adult 12/06/2018  ? Left carotid bruit 04/14/2021  ? Toe pain, right 04/15/2021  ? Palpitations 05/20/2021  ? Renal cyst 05/20/2021  ? ?Resolved Ambulatory Problems  ?  Diagnosis Date Noted  ? Uncontrolled type 2 diabetes mellitus with complication, without long-term current use of insulin 06/14/2015  ? ?Past Medical History:  ?Diagnosis Date  ? Diabetes mellitus without complication (HCC)   ? Hypertension   ? ? ? ? ?Review of Systems ?See HPI.  ?   ?Objective:  ? Physical Exam ?Vitals reviewed.  ?Constitutional:   ?   Appearance: Normal appearance. She is obese.  ?HENT:  ?   Head: Normocephalic.  ?Neck:  ?   Vascular: No carotid bruit.  ?Cardiovascular:  ?   Rate and Rhythm: Normal rate and regular  rhythm.  ?   Pulses: Normal pulses.  ?   Heart sounds: Normal heart sounds.  ?Pulmonary:  ?   Effort: Pulmonary effort is normal.  ?   Breath sounds: Normal breath sounds.  ?Musculoskeletal:  ?   Right lower leg: No edema.  ?   Left lower leg: No edema.  ?Lymphadenopathy:  ?   Cervical: No cervical adenopathy.  ?Neurological:  ?   General: No focal deficit present.  ?   Mental Status: She is alert and oriented to person, place, and time.  ?Psychiatric:     ?   Mood and Affect: Mood normal.  ? ? ? ? ? ?   ?Assessment & Plan:  ?..Tammy Bray was seen today for palpitations. ? ?Diagnoses and all orders for this visit: ? ?Palpitations ?-     EKG 12-Lead ? ? ?EKG in office today does not show any arrhthymias, ST elevation or depression. ?TSH and CBC done in ED.  ?Reassurance given with HO of the most likey causes of palpitations and how usually benign ?Hemoglobin was a little low. Start iron supplement daily ?Symptoms did start with zetia, stop and recheck in next 2-4 weeks ?If symptoms worsening or changing or CP develops please reach out sooner and will order zio patch. ? ?

## 2021-05-20 ENCOUNTER — Encounter: Payer: Self-pay | Admitting: Physician Assistant

## 2021-05-20 DIAGNOSIS — N281 Cyst of kidney, acquired: Secondary | ICD-10-CM | POA: Insufficient documentation

## 2021-05-20 DIAGNOSIS — R002 Palpitations: Secondary | ICD-10-CM | POA: Insufficient documentation

## 2021-05-26 LAB — HM DIABETES EYE EXAM

## 2021-06-12 ENCOUNTER — Encounter: Payer: Self-pay | Admitting: Physician Assistant

## 2021-07-14 ENCOUNTER — Encounter: Payer: Self-pay | Admitting: Physician Assistant

## 2021-07-14 ENCOUNTER — Ambulatory Visit (INDEPENDENT_AMBULATORY_CARE_PROVIDER_SITE_OTHER): Payer: BC Managed Care – PPO | Admitting: Physician Assistant

## 2021-07-14 VITALS — BP 112/67 | HR 87 | Ht 65.0 in | Wt 192.0 lb

## 2021-07-14 DIAGNOSIS — E119 Type 2 diabetes mellitus without complications: Secondary | ICD-10-CM | POA: Diagnosis not present

## 2021-07-14 DIAGNOSIS — N951 Menopausal and female climacteric states: Secondary | ICD-10-CM

## 2021-07-14 LAB — POCT GLYCOSYLATED HEMOGLOBIN (HGB A1C): Hemoglobin A1C: 6.4 % — AB (ref 4.0–5.6)

## 2021-07-14 MED ORDER — ESTRADIOL 0.1 MG/GM VA CREA: 1.0000 | TOPICAL_CREAM | VAGINAL | 2 refills | Status: DC

## 2021-07-14 NOTE — Progress Notes (Signed)
? ?Established Patient Office Visit ? ?Subjective   ?Patient ID: Tammy Bray, female    DOB: 09-30-1960  Age: 61 y.o. MRN: [id number] ? ?Chief Complaint  ?Patient presents with  ? Diabetes  ? ? ?HPI ?Pt is a 61 yo female with T2DM, HTN, HLD who presents to the clinic for follow up.  ? ?Pt is doing well. No concerns. She is taking her medications and has no complaints. No open sores or wounds. No hypoglycemic events. Not checking sugars. Not exercising but keeping a good healthy diet. No CP, palpitations, headaches or vision changes.  ? ?Patient Active Problem List  ? Diagnosis Date Noted  ? Vaginal dryness, menopausal 07/14/2021  ? Palpitations 05/20/2021  ? Renal cyst 05/20/2021  ? Toe pain, right 04/15/2021  ? Left carotid bruit 04/14/2021  ? Class 1 obesity due to excess calories with serious comorbidity and body mass index (BMI) of 32.0 to 32.9 in adult 12/06/2018  ? Vaginal discharge 06/06/2018  ? Hematuria 01/15/2017  ? Controlled type 2 diabetes mellitus without complication, without long-term current use of insulin (HCC) 08/26/2016  ? Abdominal pain 02/05/2016  ? Tachycardia 11/25/2014  ? Diabetes mellitus, type 2 (HCC) 01/13/2013  ? Essential hypertension, benign 01/13/2013  ? Dyslipidemia, goal LDL below 70 01/13/2013  ? Right knee pain 01/13/2013  ? IT band syndrome 01/13/2013  ? Right hip pain 01/13/2013  ? ?  ? ?ROS ? ?  ?Objective:  ?  ? ?BP 112/67   Pulse 87   Ht 5\' 5"  (1.651 m)   Wt 192 lb (87.1 kg)   SpO2 100%   BMI 31.95 kg/m?  ?BP Readings from Last 3 Encounters:  ?07/14/21 112/67  ?05/19/21 138/78  ?04/14/21 108/72  ? ?Wt Readings from Last 3 Encounters:  ?07/14/21 192 lb (87.1 kg)  ?05/19/21 196 lb (88.9 kg)  ?04/14/21 198 lb 0.6 oz (89.8 kg)  ? ?  ? ?Physical Exam ?Vitals reviewed.  ?Constitutional:   ?   Appearance: Normal appearance. She is obese.  ?HENT:  ?   Head: Normocephalic.  ?Cardiovascular:  ?   Rate and Rhythm: Normal rate and regular rhythm.  ?   Pulses: Normal pulses.   ?Pulmonary:  ?   Effort: Pulmonary effort is normal.  ?   Breath sounds: Normal breath sounds.  ?Musculoskeletal:  ?   Cervical back: Neck supple.  ?   Right lower leg: No edema.  ?   Left lower leg: No edema.  ?Lymphadenopathy:  ?   Cervical: No cervical adenopathy.  ?Neurological:  ?   General: No focal deficit present.  ?   Mental Status: She is alert and oriented to person, place, and time.  ?Psychiatric:     ?   Mood and Affect: Mood normal.  ? ? ? ? ?Results for orders placed or performed in visit on 07/14/21  ?HM MAMMOGRAPHY  ?Result Value Ref Range  ? HM Mammogram 0-4 Bi-Rad 0-4 Bi-Rad, Self Reported Normal  ?POCT glycosylated hemoglobin (Hb A1C)  ?Result Value Ref Range  ? Hemoglobin A1C 6.4 (A) 4.0 - 5.6 %  ? HbA1c POC (<> result, manual entry)    ? HbA1c, POC (prediabetic range)    ? HbA1c, POC (controlled diabetic range)    ? ? ? ?  ?Assessment & Plan:  ?Tammy Bray was seen today for diabetes. ? ?Diagnoses and all orders for this visit: ? ?Controlled type 2 diabetes mellitus without complication, without long-term current use of insulin (HCC) ?-  POCT glycosylated hemoglobin (Hb A1C) ? ?Vaginal dryness, menopausal ?-     estradiol (ESTRACE VAGINAL) 0.1 MG/GM vaginal cream; Place 1 Applicatorful vaginally 3 (three) times a week. (2g) ? ? ?A1C to goal and back down ?Continue on same medications ?BP to goal on ACE ?On statin ?Foot and eye exam UTD ?Declined shingles ?Flu and covid x2 UTD ?Follow up in 6 months ? ?Added estrace 3 times a week for vaginal dryness ? ? ?[name], PA-C ? ?

## 2021-10-28 ENCOUNTER — Other Ambulatory Visit: Payer: Self-pay | Admitting: Physician Assistant

## 2021-10-28 DIAGNOSIS — E119 Type 2 diabetes mellitus without complications: Secondary | ICD-10-CM

## 2021-10-28 DIAGNOSIS — I10 Essential (primary) hypertension: Secondary | ICD-10-CM

## 2022-01-03 DIAGNOSIS — Z1211 Encounter for screening for malignant neoplasm of colon: Secondary | ICD-10-CM | POA: Diagnosis not present

## 2022-01-14 ENCOUNTER — Encounter: Payer: Self-pay | Admitting: Physician Assistant

## 2022-01-14 ENCOUNTER — Ambulatory Visit (INDEPENDENT_AMBULATORY_CARE_PROVIDER_SITE_OTHER): Payer: BC Managed Care – PPO | Admitting: Physician Assistant

## 2022-01-14 VITALS — BP 110/71 | HR 69 | Ht 65.0 in | Wt 196.0 lb

## 2022-01-14 DIAGNOSIS — I10 Essential (primary) hypertension: Secondary | ICD-10-CM

## 2022-01-14 DIAGNOSIS — E119 Type 2 diabetes mellitus without complications: Secondary | ICD-10-CM

## 2022-01-14 DIAGNOSIS — Z23 Encounter for immunization: Secondary | ICD-10-CM | POA: Diagnosis not present

## 2022-01-14 DIAGNOSIS — R809 Proteinuria, unspecified: Secondary | ICD-10-CM

## 2022-01-14 DIAGNOSIS — E785 Hyperlipidemia, unspecified: Secondary | ICD-10-CM

## 2022-01-14 LAB — POCT GLYCOSYLATED HEMOGLOBIN (HGB A1C): Hemoglobin A1C: 6.6 % — AB (ref 4.0–5.6)

## 2022-01-14 LAB — POCT UA - MICROALBUMIN
Creatinine, POC: 50 mg/dL
Microalbumin Ur, POC: 30 mg/L

## 2022-01-14 LAB — COLOGUARD: COLOGUARD: NEGATIVE

## 2022-01-14 NOTE — Progress Notes (Signed)
Established Patient Office Visit  Subjective   Patient ID: Tammy Bray, female    DOB: 04/04/1960  Age: 61 y.o. MRN: [id number]  Chief Complaint  Patient presents with   Diabetes   Follow-up    HPI Pt is a 61 yo female with T2DM, HTN, HLD who presents to the clinic for 3 month follow up.   She is doing well. No concerns. She does feel like she has not done as well with diet over last 3 months. No hypoglycemic events. Not checking sugars. She is compliant with medications. No open wounds or sores. She denies any CP, palpitations, headaches or dizziness.   .. Active Ambulatory Problems    Diagnosis Date Noted   Diabetes mellitus, type 2 (HCC) 01/13/2013   Essential hypertension, benign 01/13/2013   Dyslipidemia, goal LDL below 70 01/13/2013   Right knee pain 01/13/2013   IT band syndrome 01/13/2013   Right hip pain 01/13/2013   Tachycardia 11/25/2014   Abdominal pain 02/05/2016   Controlled type 2 diabetes mellitus without complication, without long-term current use of insulin (HCC) 08/26/2016   Hematuria 01/15/2017   Vaginal discharge 06/06/2018   Class 1 obesity due to excess calories with serious comorbidity and body mass index (BMI) of 32.0 to 32.9 in adult 12/06/2018   Left carotid bruit 04/14/2021   Toe pain, right 04/15/2021   Palpitations 05/20/2021   Renal cyst 05/20/2021   Vaginal dryness, menopausal 07/14/2021   Microalbuminuria 01/14/2022   Resolved Ambulatory Problems    Diagnosis Date Noted   Uncontrolled type 2 diabetes mellitus with complication, without long-term current use of insulin 06/14/2015   Past Medical History:  Diagnosis Date   Diabetes mellitus without complication (HCC)    Hypertension      Review of Systems  All other systems reviewed and are negative.     Objective:     BP 110/71   Pulse 69   Ht 5\' 5"  (1.651 m)   Wt 196 lb (88.9 kg)   SpO2 99%   BMI 32.62 kg/m  BP Readings from Last 3 Encounters:  01/14/22 110/71   07/14/21 112/67  05/19/21 138/78   Wt Readings from Last 3 Encounters:  01/14/22 196 lb (88.9 kg)  07/14/21 192 lb (87.1 kg)  05/19/21 196 lb (88.9 kg)    .[name] Kitchen Results for orders placed or performed in visit on 01/14/22  POCT glycosylated hemoglobin (Hb A1C)  Result Value Ref Range   Hemoglobin A1C 6.6 (A) 4.0 - 5.6 %   HbA1c POC (<> result, manual entry)     HbA1c, POC (prediabetic range)     HbA1c, POC (controlled diabetic range)    POCT UA - Microalbumin  Result Value Ref Range   Microalbumin Ur, POC 30 mg/L   Creatinine, POC 50 mg/dL   Albumin/Creatinine Ratio, Urine, POC 30-300      Physical Exam Constitutional:      Appearance: Normal appearance. She is obese.  HENT:     Head: Normocephalic.  Cardiovascular:     Rate and Rhythm: Normal rate and regular rhythm.     Pulses: Normal pulses.  Pulmonary:     Effort: Pulmonary effort is normal.     Breath sounds: Normal breath sounds.  Neurological:     General: No focal deficit present.     Mental Status: She is alert and oriented to person, place, and time.  Psychiatric:        Mood and Affect: Mood normal.  Assessment & Plan:  [name] KitchenMarland KitchenHemlata was seen today for diabetes and follow-up.  Diagnoses and all orders for this visit:  Controlled type 2 diabetes mellitus without complication, without long-term current use of insulin (HCC) -     POCT glycosylated hemoglobin (Hb A1C) -     POCT UA - Microalbumin  Essential hypertension, benign  Need for immunization against influenza -     Flu Vaccine QUAD 48mo+IM (Fluarix, Fluzone & Alfiuria Quad PF)  Dyslipidemia, goal LDL below 70  Microalbuminuria   A!C is up some today but under 7 No med changes made today Discussed diet changes BP to goal abnormal microalbuminuria on ACE and SGLT-2 for prevention, UTD GFR stable.  UTD eye and foot exam Flu shot given Declined covid vaccine Follow up in 3 months.  Labs in 3 months   Return in about 3 months  (around 04/16/2022).    [name], PA-C

## 2022-01-14 NOTE — Patient Instructions (Signed)

## 2022-01-14 NOTE — Progress Notes (Signed)
Negative cologuard. GREAT news. Recheck in 3 years.

## 2022-01-14 NOTE — Progress Notes (Signed)
You do have some protein in urine. This means we need to keep close eye on kidneys for any decrease in function. You are on 2 medications that help protect kidney. Continue to stay hydrated and keep sugars and BP controlled as you are. Will will continue to monitor GFR every 3-6 months.

## 2022-02-26 ENCOUNTER — Telehealth: Payer: Self-pay | Admitting: Neurology

## 2022-02-26 NOTE — Telephone Encounter (Signed)
Patient called and LVM asking about coupon cards for [employer]. Called back, LVM letting her know coupon card and 2 boxes of samples at the front for her to pick up, she is to call with questions.

## 2022-04-17 ENCOUNTER — Ambulatory Visit: Payer: BC Managed Care – PPO | Admitting: Physician Assistant

## 2022-05-08 DIAGNOSIS — Z1231 Encounter for screening mammogram for malignant neoplasm of breast: Secondary | ICD-10-CM | POA: Diagnosis not present

## 2022-05-08 LAB — HM MAMMOGRAPHY

## 2022-05-15 ENCOUNTER — Encounter: Payer: Self-pay | Admitting: Physician Assistant

## 2022-05-18 ENCOUNTER — Other Ambulatory Visit: Payer: Self-pay | Admitting: Physician Assistant

## 2022-05-18 DIAGNOSIS — N281 Cyst of kidney, acquired: Secondary | ICD-10-CM

## 2022-05-25 ENCOUNTER — Ambulatory Visit: Payer: BC Managed Care – PPO | Admitting: Physician Assistant

## 2022-05-25 ENCOUNTER — Other Ambulatory Visit: Payer: Self-pay | Admitting: Physician Assistant

## 2022-05-25 DIAGNOSIS — I10 Essential (primary) hypertension: Secondary | ICD-10-CM

## 2022-05-25 DIAGNOSIS — E119 Type 2 diabetes mellitus without complications: Secondary | ICD-10-CM

## 2022-05-25 DIAGNOSIS — Z1329 Encounter for screening for other suspected endocrine disorder: Secondary | ICD-10-CM

## 2022-05-25 DIAGNOSIS — E785 Hyperlipidemia, unspecified: Secondary | ICD-10-CM

## 2022-05-25 DIAGNOSIS — Z79899 Other long term (current) drug therapy: Secondary | ICD-10-CM

## 2022-05-26 ENCOUNTER — Other Ambulatory Visit: Payer: Self-pay | Admitting: Physician Assistant

## 2022-05-26 DIAGNOSIS — E785 Hyperlipidemia, unspecified: Secondary | ICD-10-CM

## 2022-06-05 IMAGING — US US CAROTID DUPLEX BILAT
1 series · 13 of 24 positions shown · non-contrast
Comparison: None.

CLINICAL DATA: 60-year-old female with history of left carotid
bruit.

EXAM:
BILATERAL CAROTID DUPLEX ULTRASOUND
TECHNIQUE: Gray scale imaging, color Doppler and duplex ultrasound were
performed of bilateral carotid and vertebral arteries in the neck.

[Series 1: us carotid bilateral · 13 of 59 slices shown]
[im 1/59]
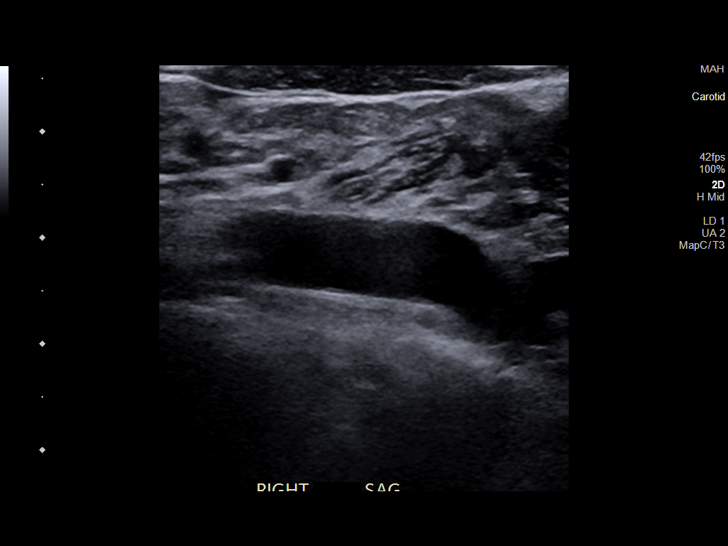
[im 6/59]
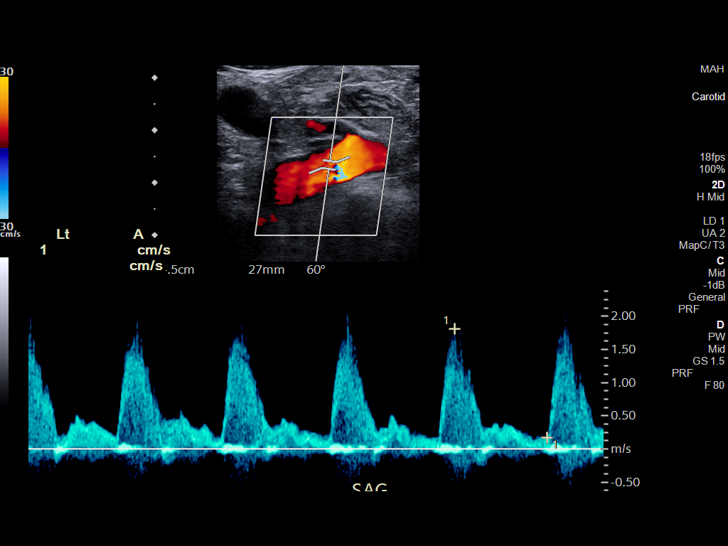
[im 11/59]
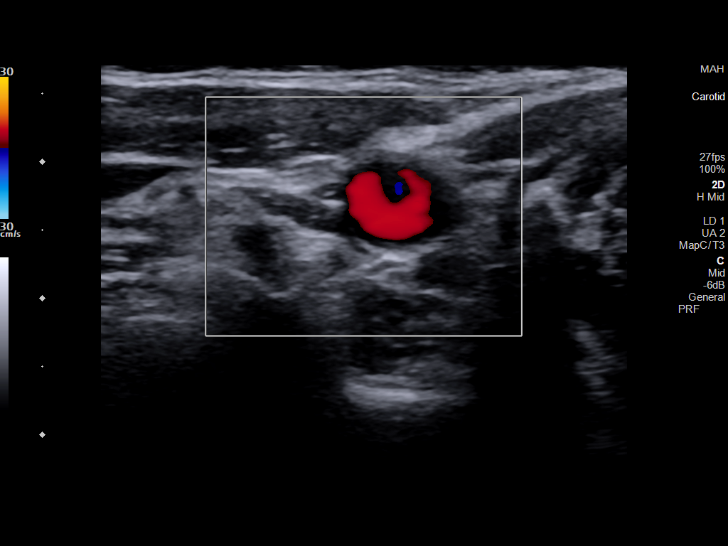
[im 16/59]
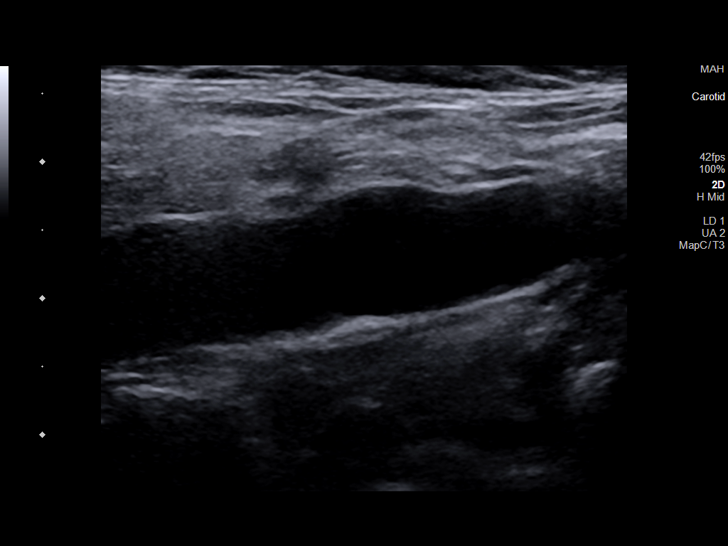
[im 21/59]
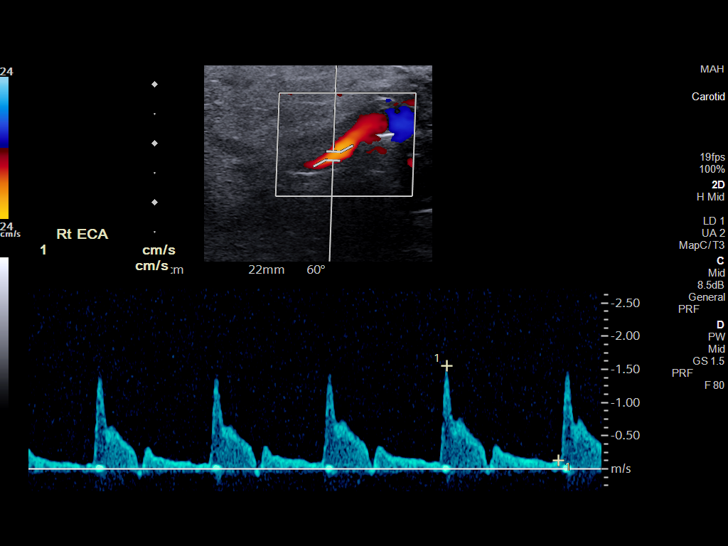
[im 26/59]
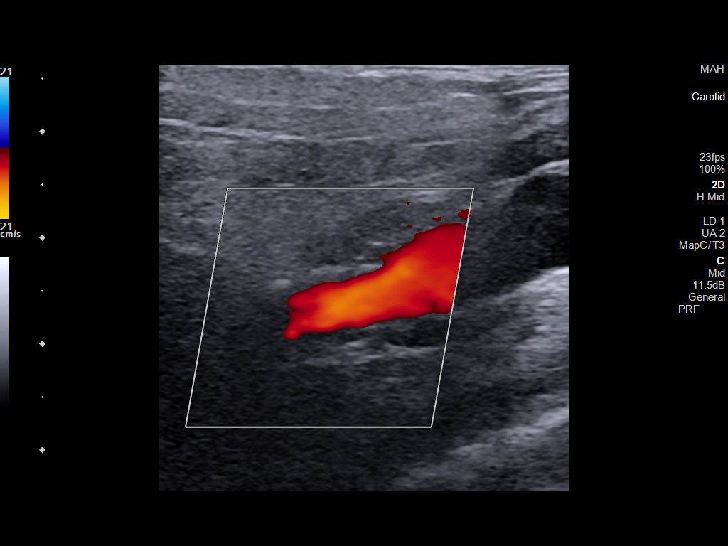
[im 31/59]
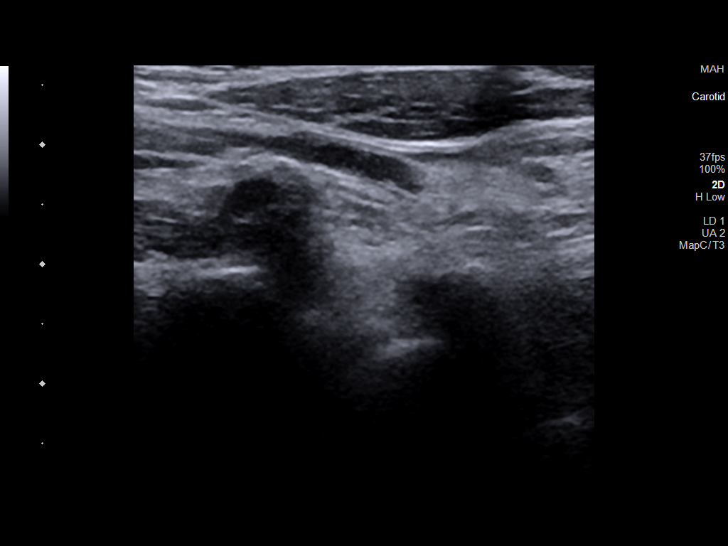
[im 33/59]
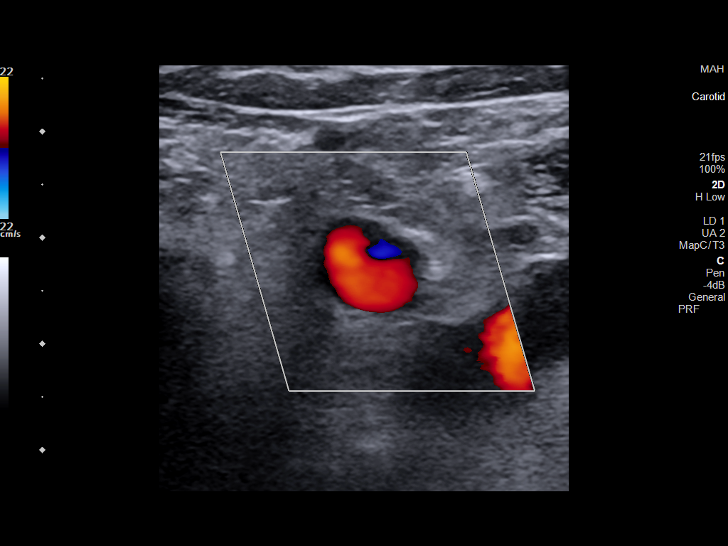
[im 38/59]
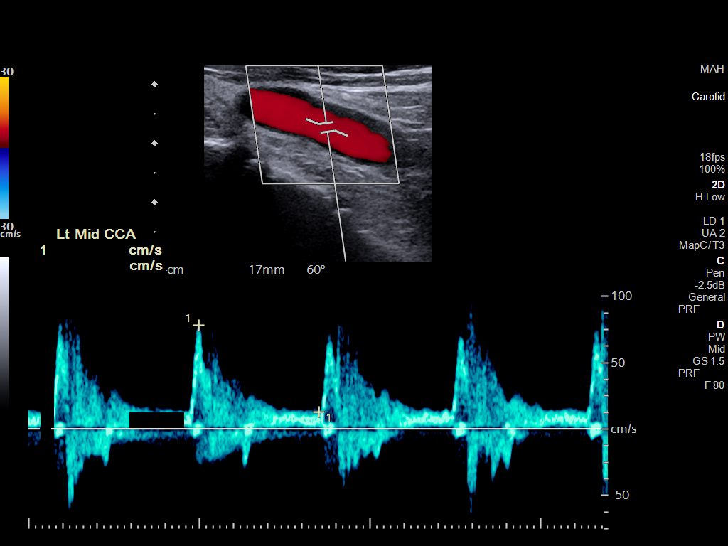
[im 43/59]
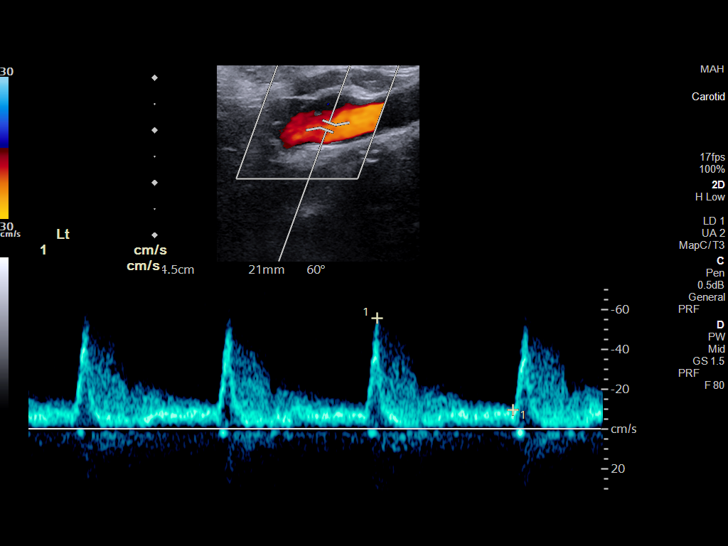
[im 48/59]
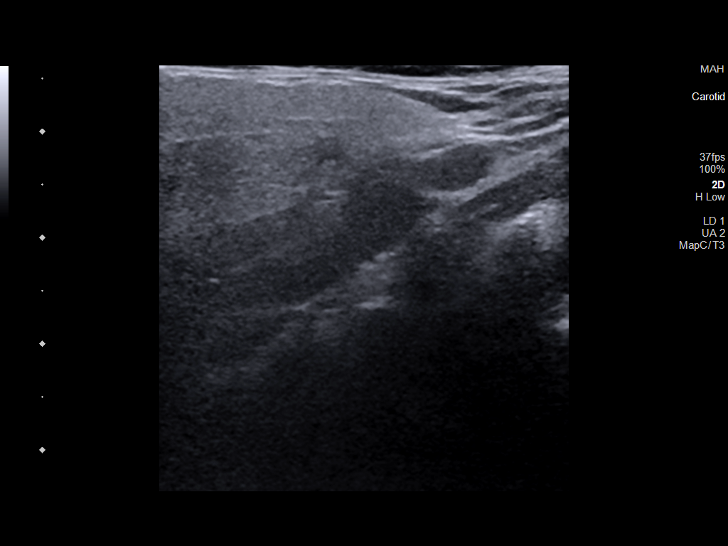
[im 53/59]
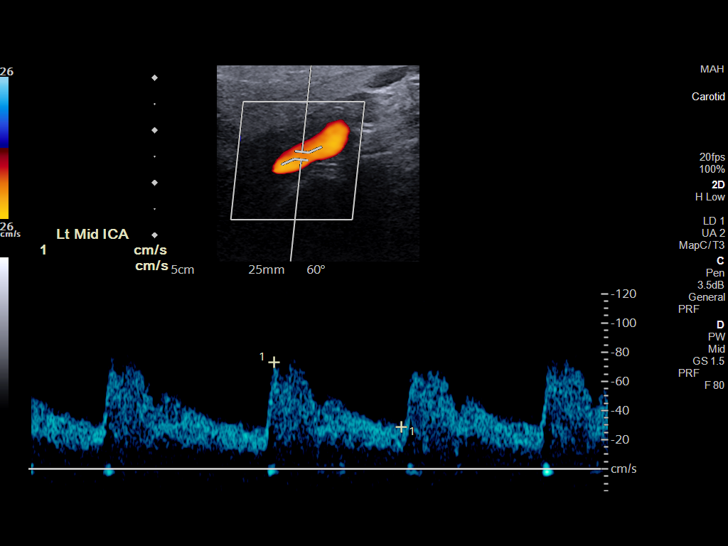
[im 59/59]
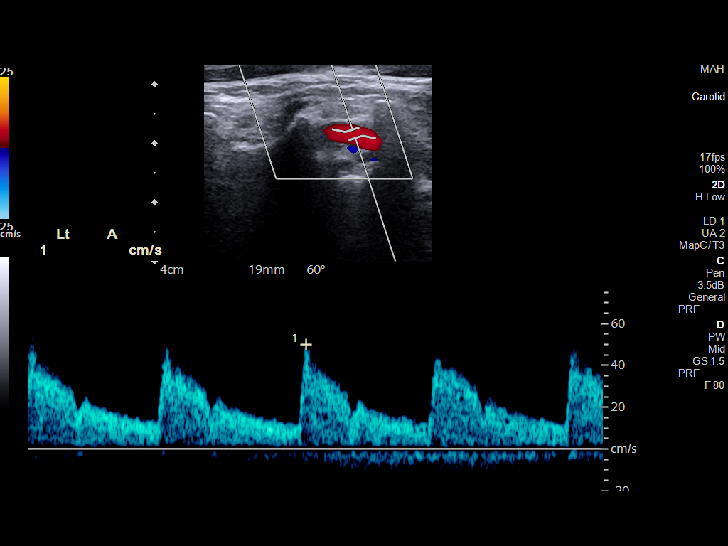

[13 of 24 positions shown; findings below may reference images not displayed]

FINDINGS: Criteria: Quantification of carotid stenosis is based on velocity
parameters that correlate the residual internal carotid diameter
with NASCET-based stenosis levels, using the diameter of the distal
internal carotid lumen as the denominator for stenosis measurement.

The following velocity measurements were obtained:

RIGHT

ICA: Peak systolic velocity 69 cm/sec, End diastolic velocity 27
cm/sec

CCA: Peak systolic velocity 87 cm/sec

SYSTOLIC ICA/CCA RATIO:

ECA: Peak systolic velocity 155 cm/sec

LEFT

ICA: Peak systolic velocity 83 cm/sec, End diastolic velocity 36
cm/sec

CCA: 78 cm/sec

SYSTOLIC ICA/CCA RATIO:

ECA: 52 cm/sec

RIGHT CAROTID ARTERY: No atherosclerotic plaque formation. No
significant tortuosity. Normal low resistance waveforms.

RIGHT VERTEBRAL ARTERY:  Not visualized.

LEFT CAROTID ARTERY: No atherosclerotic plaque formation. No
significant tortuosity. Normal low resistance waveforms.

LEFT VERTEBRAL ARTERY:  Antegrade flow.

Upper extremity non-invasive blood pressures:

Right: 119/80 mm/Hg

Left: 132/74 mm/Hg
IMPRESSION: 1. Right carotid artery system: Patent without significant
atherosclerotic plaque formation.

2. Left carotid artery system: Patent without significant
atherosclerotic plaque formation.

3. Vertebral artery system: The right vertebral artery is not
visualized. The left vertebral artery is patent with antegrade flow.

## 2022-06-20 ENCOUNTER — Other Ambulatory Visit: Payer: Self-pay | Admitting: Physician Assistant

## 2022-06-20 DIAGNOSIS — I10 Essential (primary) hypertension: Secondary | ICD-10-CM

## 2022-06-20 DIAGNOSIS — E119 Type 2 diabetes mellitus without complications: Secondary | ICD-10-CM

## 2022-06-24 ENCOUNTER — Other Ambulatory Visit: Payer: Self-pay | Admitting: Physician Assistant

## 2022-06-24 DIAGNOSIS — E119 Type 2 diabetes mellitus without complications: Secondary | ICD-10-CM

## 2022-07-05 ENCOUNTER — Other Ambulatory Visit: Payer: Self-pay | Admitting: Physician Assistant

## 2022-07-05 DIAGNOSIS — I10 Essential (primary) hypertension: Secondary | ICD-10-CM

## 2022-07-05 DIAGNOSIS — E119 Type 2 diabetes mellitus without complications: Secondary | ICD-10-CM

## 2022-07-06 ENCOUNTER — Ambulatory Visit (INDEPENDENT_AMBULATORY_CARE_PROVIDER_SITE_OTHER): Payer: BC Managed Care – PPO | Admitting: Physician Assistant

## 2022-07-06 ENCOUNTER — Encounter: Payer: Self-pay | Admitting: Physician Assistant

## 2022-07-06 VITALS — BP 111/69 | HR 72 | Ht 65.0 in | Wt 196.0 lb

## 2022-07-06 DIAGNOSIS — Z1329 Encounter for screening for other suspected endocrine disorder: Secondary | ICD-10-CM

## 2022-07-06 DIAGNOSIS — I10 Essential (primary) hypertension: Secondary | ICD-10-CM | POA: Diagnosis not present

## 2022-07-06 DIAGNOSIS — E119 Type 2 diabetes mellitus without complications: Secondary | ICD-10-CM

## 2022-07-06 DIAGNOSIS — E785 Hyperlipidemia, unspecified: Secondary | ICD-10-CM

## 2022-07-06 LAB — CBC WITH DIFFERENTIAL/PLATELET
Basophils Relative: 0.5 %
Monocytes Relative: 8.2 %
Neutrophils Relative %: 36 %

## 2022-07-06 MED ORDER — DAPAGLIFLOZIN PROPANEDIOL 10 MG PO TABS: 10.0000 mg | ORAL_TABLET | Freq: Every day | ORAL | 0 refills | Status: DC

## 2022-07-06 MED ORDER — LISINOPRIL 10 MG PO TABS
ORAL_TABLET | ORAL | 1 refills | Status: DC
Start: 2022-07-06 — End: 2022-12-30

## 2022-07-06 MED ORDER — ROSUVASTATIN CALCIUM 40 MG PO TABS: 40.0000 mg | ORAL_TABLET | Freq: Every day | ORAL | 3 refills | Status: DC

## 2022-07-06 MED ORDER — PIOGLITAZONE HCL 30 MG PO TABS: 30.0000 mg | ORAL_TABLET | Freq: Every day | ORAL | 1 refills | Status: DC

## 2022-07-06 NOTE — Progress Notes (Unsigned)
Established Patient Office Visit  Subjective   Patient ID: Tammy Bray, female    DOB: November 30, 1960  Age: 62 y.o. MRN: [id number]  Chief Complaint  Patient presents with   Diabetes    HPI Pt is a 62 yo obese female with T2DM, HLD, HTN who presents to the clinic for 6 month follow up.   A1C have been controlled over the last few years. Pt is checking her sugars and running 87-109 in the mornings. No hypoglycemia. No open sores or wounds. She is taking her medication daily. Denies any CP, palpitations, headaches or vision changes. She does have some intermittent lower ankle edema that comes and goes.   .. Active Ambulatory Problems    Diagnosis Date Noted   Diabetes mellitus, type 2 01/13/2013   Essential hypertension, benign 01/13/2013   Dyslipidemia, goal LDL below 70 01/13/2013   Right knee pain 01/13/2013   IT band syndrome 01/13/2013   Right hip pain 01/13/2013   Tachycardia 11/25/2014   Abdominal pain 02/05/2016   Controlled type 2 diabetes mellitus without complication, without long-term current use of insulin 08/26/2016   Hematuria 01/15/2017   Vaginal discharge 06/06/2018   Class 1 obesity due to excess calories with serious comorbidity and body mass index (BMI) of 32.0 to 32.9 in adult 12/06/2018   Left carotid bruit 04/14/2021   Toe pain, right 04/15/2021   Palpitations 05/20/2021   Renal cyst 05/20/2021   Vaginal dryness, menopausal 07/14/2021   Microalbuminuria 01/14/2022   Resolved Ambulatory Problems    Diagnosis Date Noted   Uncontrolled type 2 diabetes mellitus with complication, without long-term current use of insulin 06/14/2015   Past Medical History:  Diagnosis Date   Diabetes mellitus without complication    Hypertension      ROS See HPI.    Objective:     BP 111/69 (BP Location: Left Arm, Patient Position: Sitting, Cuff Size: Large)   Pulse 72   Ht 5\' 5"  (1.651 m)   Wt 196 lb (88.9 kg)   SpO2 100%   BMI 32.62 kg/m  BP Readings from  Last 3 Encounters:  07/06/22 111/69  01/14/22 110/71  07/14/21 112/67   Wt Readings from Last 3 Encounters:  07/06/22 196 lb (88.9 kg)  01/14/22 196 lb (88.9 kg)  07/14/21 192 lb (87.1 kg)    .[name] Kitchen Results for orders placed or performed in visit on 07/06/22  Lipid Panel w/reflex Direct LDL  Result Value Ref Range   Cholesterol 199 <200 mg/dL   HDL 68 > OR = 50 mg/dL   Triglycerides 66 <244 mg/dL   LDL Cholesterol (Calc) 115 (H) mg/dL (calc)   Total CHOL/HDL Ratio 2.9 <5.0 (calc)   Non-HDL Cholesterol (Calc) 131 (H) <130 mg/dL (calc)  COMPLETE METABOLIC PANEL WITH GFR  Result Value Ref Range   Glucose, Bld 85 65 - 99 mg/dL   BUN 21 7 - 25 mg/dL   Creat 0.10 2.72 - 5.36 mg/dL   eGFR 64 > OR = 60 UY/QIH/4.74Q5   BUN/Creatinine Ratio SEE NOTE: 6 - 22 (calc)   Sodium 144 135 - 146 mmol/L   Potassium 4.4 3.5 - 5.3 mmol/L   Chloride 110 98 - 110 mmol/L   CO2 27 20 - 32 mmol/L   Calcium 10.3 8.6 - 10.4 mg/dL   Total Protein 7.6 6.1 - 8.1 g/dL   Albumin 4.4 3.6 - 5.1 g/dL   Globulin 3.2 1.9 - 3.7 g/dL (calc)   AG Ratio 1.4 1.0 - 2.5 (  calc)   Total Bilirubin 0.7 0.2 - 1.2 mg/dL   Alkaline phosphatase (APISO) 58 37 - 153 U/L   AST 13 10 - 35 U/L   ALT 9 6 - 29 U/L  TSH  Result Value Ref Range   TSH 1.25 0.40 - 4.50 mIU/L  CBC w/Diff/Platelet  Result Value Ref Range   WBC 4.3 3.8 - 10.8 Thousand/uL   RBC 4.54 3.80 - 5.10 Million/uL   Hemoglobin 11.7 11.7 - 15.5 g/dL   HCT 65.7 84.6 - 96.2 %   MCV 80.6 80.0 - 100.0 fL   MCH 25.8 (L) 27.0 - 33.0 pg   MCHC 32.0 32.0 - 36.0 g/dL   RDW 95.2 84.1 - 32.4 %   Platelets 262 140 - 400 Thousand/uL   MPV 9.5 7.5 - 12.5 fL   Neutro Abs 1,548 1,500 - 7,800 cells/uL   Lymphs Abs 2,318 850 - 3,900 cells/uL   Absolute Monocytes 353 200 - 950 cells/uL   Eosinophils Absolute 60 15 - 500 cells/uL   Basophils Absolute 22 0 - 200 cells/uL   Neutrophils Relative % 36 %   Total Lymphocyte 53.9 %   Monocytes Relative 8.2 %   Eosinophils  Relative 1.4 %   Basophils Relative 0.5 %  Hemoglobin A1c  Result Value Ref Range   Hgb A1c MFr Bld 6.6 (H) <5.7 % of total Hgb   Mean Plasma Glucose 143 mg/dL   eAG (mmol/L) 7.9 mmol/L  Microalbumin / creatinine urine ratio  Result Value Ref Range   Creatinine, Urine 48 20 - 275 mg/dL   Microalb, Ur 0.2 mg/dL   Microalb Creat Ratio 4 <30 mg/g creat     Physical Exam Constitutional:      Appearance: Normal appearance. She is obese.  HENT:     Head: Normocephalic.  Neck:     Vascular: No carotid bruit.  Cardiovascular:     Rate and Rhythm: Normal rate and regular rhythm.     Pulses: Normal pulses.  Pulmonary:     Effort: Pulmonary effort is normal.     Breath sounds: Normal breath sounds.  Musculoskeletal:     Cervical back: Normal range of motion and neck supple. No rigidity or tenderness.     Right lower leg: No edema.     Left lower leg: No edema.  Lymphadenopathy:     Cervical: No cervical adenopathy.  Neurological:     General: No focal deficit present.     Mental Status: She is alert and oriented to person, place, and time.  Psychiatric:        Mood and Affect: Mood normal.       The 10-year ASCVD risk score (Arnett DK, et al., 2019) is: 11.1%    Assessment & Plan:  [name] KitchenMarland KitchenMei was seen today for diabetes.  Diagnoses and all orders for this visit:  Controlled type 2 diabetes mellitus without complication, without long-term current use of insulin -     Cancel: POCT HgB A1C -     Cancel: POCT UA - Microalbumin -     COMPLETE METABOLIC PANEL WITH GFR -     CBC w/Diff/Platelet -     Hemoglobin A1c -     Microalbumin / creatinine urine ratio -     lisinopril (ZESTRIL) 10 MG tablet; TAKE 1 TABLET BY MOUTH EVERY DAY. -     pioglitazone (ACTOS) 30 MG tablet; Take 1 tablet (30 mg total) by mouth daily. -     dapagliflozin propanediol (  FARXIGA) 10 MG TABS tablet; Take 1 tablet (10 mg total) by mouth daily.  Essential hypertension, benign -     COMPLETE  METABOLIC PANEL WITH GFR -     CBC w/Diff/Platelet -     lisinopril (ZESTRIL) 10 MG tablet; TAKE 1 TABLET BY MOUTH EVERY DAY.  Dyslipidemia, goal LDL below 70 -     Lipid Panel w/reflex Direct LDL -     COMPLETE METABOLIC PANEL WITH GFR -     CBC w/Diff/Platelet -     rosuvastatin (CRESTOR) 40 MG tablet; Take 1 tablet (40 mg total) by mouth daily.  Thyroid disorder screening -     TSH   Checked A1C in labs and stable Continue on same medications BP to goal, on ACE Normal microalbumin On statin, LDL not to goal(sent my chart msg to discuss adding PSK-9 to statin) Eye and foot exam UTD Pt does not smoke Declined covid vaccine booster Flu and pneumonia vaccine UTD.  Follow up in 6 months    Return in about 6 months (around 01/05/2023).    [name], PA-C

## 2022-07-07 ENCOUNTER — Encounter: Payer: Self-pay | Admitting: Physician Assistant

## 2022-07-07 LAB — COMPLETE METABOLIC PANEL WITH GFR
AG Ratio: 1.4 (calc) (ref 1.0–2.5)
ALT: 9 U/L (ref 6–29)
AST: 13 U/L (ref 10–35)
Albumin: 4.4 g/dL (ref 3.6–5.1)
Alkaline phosphatase (APISO): 58 U/L (ref 37–153)
BUN: 21 mg/dL (ref 7–25)
CO2: 27 mmol/L (ref 20–32)
Calcium: 10.3 mg/dL (ref 8.6–10.4)
Chloride: 110 mmol/L (ref 98–110)
Creat: 1 mg/dL (ref 0.50–1.05)
Globulin: 3.2 g/dL (calc) (ref 1.9–3.7)
Glucose, Bld: 85 mg/dL (ref 65–99)
Potassium: 4.4 mmol/L (ref 3.5–5.3)
Sodium: 144 mmol/L (ref 135–146)
Total Bilirubin: 0.7 mg/dL (ref 0.2–1.2)
Total Protein: 7.6 g/dL (ref 6.1–8.1)
eGFR: 64 mL/min/{1.73_m2} (ref >=60)

## 2022-07-07 LAB — CBC WITH DIFFERENTIAL/PLATELET
Absolute Monocytes: 353 cells/uL (ref 200–950)
Basophils Absolute: 22 cells/uL (ref 0–200)
Eosinophils Absolute: 60 cells/uL (ref 15–500)
Eosinophils Relative: 1.4 %
HCT: 36.6 % (ref 35.0–45.0)
Hemoglobin: 11.7 g/dL (ref 11.7–15.5)
Lymphs Abs: 2318 cells/uL (ref 850–3900)
MCH: 25.8 pg — ABNORMAL LOW (ref 27.0–33.0)
MCHC: 32 g/dL (ref 32.0–36.0)
MCV: 80.6 fL (ref 80.0–100.0)
MPV: 9.5 fL (ref 7.5–12.5)
Neutro Abs: 1548 cells/uL (ref 1500–7800)
Platelets: 262 10*3/uL (ref 140–400)
RBC: 4.54 10*6/uL (ref 3.80–5.10)
RDW: 13.8 % (ref 11.0–15.0)
Total Lymphocyte: 53.9 %
WBC: 4.3 10*3/uL (ref 3.8–10.8)

## 2022-07-07 LAB — MICROALBUMIN / CREATININE URINE RATIO
Creatinine, Urine: 48 mg/dL (ref 20–275)
Microalb Creat Ratio: 4 mg/g creat (ref <30)
Microalb, Ur: 0.2 mg/dL

## 2022-07-07 LAB — HEMOGLOBIN A1C
Hgb A1c MFr Bld: 6.6 % of total Hgb — ABNORMAL HIGH (ref <5.7)
Mean Plasma Glucose: 143 mg/dL
eAG (mmol/L): 7.9 mmol/L

## 2022-07-07 LAB — TSH: TSH: 1.25 mIU/L (ref 0.40–4.50)

## 2022-07-07 LAB — LIPID PANEL W/REFLEX DIRECT LDL
Cholesterol: 199 mg/dL (ref <200)
HDL: 68 mg/dL (ref >=50)
LDL Cholesterol (Calc): 115 mg/dL (calc) — ABNORMAL HIGH
Non-HDL Cholesterol (Calc): 131 mg/dL (calc) — ABNORMAL HIGH (ref <130)
Total CHOL/HDL Ratio: 2.9 (calc) (ref <5.0)
Triglycerides: 66 mg/dL (ref <150)

## 2022-07-07 NOTE — Progress Notes (Signed)
Tammy Bray,   Thyroid looks great.  Kidney and liver look good.  A1C stable. Continue same medications. Ok to follow up in 6 months.  LDL not to goal. Goal is under 70. Are you taking crestor  daily? If you are then we need to consider adding something to get you to goal.

## 2022-07-09 ENCOUNTER — Telehealth: Payer: Self-pay | Admitting: Physician Assistant

## 2022-07-09 NOTE — Telephone Encounter (Signed)
Patient returned call please call her at [phone]

## 2022-07-10 MED ORDER — REPATHA SURECLICK 140 MG/ML ~~LOC~~ SOAJ: 140.0000 mg | SUBCUTANEOUS | 11 refills | Status: DC

## 2022-07-10 NOTE — Addendum Note (Signed)
Addended by: Jomarie Longs on: 07/10/2022 11:33 AM   Modules accepted: Orders

## 2022-10-02 ENCOUNTER — Other Ambulatory Visit: Payer: Self-pay | Admitting: Physician Assistant

## 2022-10-02 DIAGNOSIS — E119 Type 2 diabetes mellitus without complications: Secondary | ICD-10-CM

## 2022-10-03 DIAGNOSIS — R3915 Urgency of urination: Secondary | ICD-10-CM | POA: Diagnosis not present

## 2022-10-03 DIAGNOSIS — N3090 Cystitis, unspecified without hematuria: Secondary | ICD-10-CM | POA: Diagnosis not present

## 2022-10-06 NOTE — Telephone Encounter (Signed)
Pt called.  She is following up on pharmacy refill request for [name].

## 2022-12-28 ENCOUNTER — Other Ambulatory Visit: Payer: Self-pay | Admitting: Physician Assistant

## 2022-12-28 DIAGNOSIS — I10 Essential (primary) hypertension: Secondary | ICD-10-CM

## 2022-12-28 DIAGNOSIS — E119 Type 2 diabetes mellitus without complications: Secondary | ICD-10-CM

## 2023-01-01 ENCOUNTER — Other Ambulatory Visit: Payer: Self-pay | Admitting: Physician Assistant

## 2023-01-01 DIAGNOSIS — E119 Type 2 diabetes mellitus without complications: Secondary | ICD-10-CM

## 2023-01-05 ENCOUNTER — Ambulatory Visit (INDEPENDENT_AMBULATORY_CARE_PROVIDER_SITE_OTHER): Payer: BC Managed Care – PPO | Admitting: Physician Assistant

## 2023-01-05 ENCOUNTER — Encounter: Payer: Self-pay | Admitting: Physician Assistant

## 2023-01-05 VITALS — BP 120/72 | HR 90 | Ht 65.0 in | Wt 183.0 lb

## 2023-01-05 DIAGNOSIS — E785 Hyperlipidemia, unspecified: Secondary | ICD-10-CM | POA: Diagnosis not present

## 2023-01-05 DIAGNOSIS — E119 Type 2 diabetes mellitus without complications: Secondary | ICD-10-CM | POA: Diagnosis not present

## 2023-01-05 DIAGNOSIS — Z7984 Long term (current) use of oral hypoglycemic drugs: Secondary | ICD-10-CM

## 2023-01-05 DIAGNOSIS — I10 Essential (primary) hypertension: Secondary | ICD-10-CM

## 2023-01-05 LAB — POCT GLYCOSYLATED HEMOGLOBIN (HGB A1C): Hemoglobin A1C: 6.4 % — AB (ref 4.0–5.6)

## 2023-01-05 LAB — HM DIABETES EYE EXAM

## 2023-01-05 MED ORDER — PIOGLITAZONE HCL 30 MG PO TABS
30.0000 mg | ORAL_TABLET | Freq: Every day | ORAL | 1 refills | Status: DC
Start: 2023-01-05 — End: 2023-09-07

## 2023-01-05 MED ORDER — LISINOPRIL 10 MG PO TABS
ORAL_TABLET | ORAL | 1 refills | Status: DC
Start: 2023-01-05 — End: 2023-09-07

## 2023-01-05 MED ORDER — DAPAGLIFLOZIN PROPANEDIOL 10 MG PO TABS: 10.0000 mg | ORAL_TABLET | Freq: Every day | ORAL | 1 refills | Status: DC

## 2023-01-05 NOTE — Progress Notes (Signed)
Established Patient Office Visit  Subjective   Patient ID: Tammy Bray, female    DOB: 30-Dec-1960  Age: 62 y.o. MRN: [id number]  Chief Complaint  Patient presents with   Medical Management of Chronic Issues    6 mo fup on d/m last A1c 6.6    HPI Pt is a 62 yo female with T2DM, HTN, HLD who presents to the clinic for follow up.   She is not checking her sugars. She is taking her medication. She has lost 13lbs. She feels great. No concerns. Denies any CP, palpitations, headaches or vision changes. She started repatha 6 months ago, no concerns.   .. Active Ambulatory Problems    Diagnosis Date Noted   Diabetes mellitus, type 2 (HCC) 01/13/2013   Essential hypertension, benign 01/13/2013   Dyslipidemia, goal LDL below 70 01/13/2013   Right knee pain 01/13/2013   IT band syndrome 01/13/2013   Right hip pain 01/13/2013   Tachycardia 11/25/2014   Abdominal pain 02/05/2016   Controlled type 2 diabetes mellitus without complication, without long-term current use of insulin (HCC) 08/26/2016   Hematuria 01/15/2017   Vaginal discharge 06/06/2018   Class 1 obesity due to excess calories with serious comorbidity and body mass index (BMI) of 32.0 to 32.9 in adult 12/06/2018   Left carotid bruit 04/14/2021   Toe pain, right 04/15/2021   Palpitations 05/20/2021   Renal cyst 05/20/2021   Vaginal dryness, menopausal 07/14/2021   Microalbuminuria 01/14/2022   Resolved Ambulatory Problems    Diagnosis Date Noted   Uncontrolled type 2 diabetes mellitus with complication, without long-term current use of insulin 06/14/2015   Past Medical History:  Diagnosis Date   Diabetes mellitus without complication (HCC)    Hypertension      Review of Systems  All other systems reviewed and are negative.     Objective:     BP 120/72   Pulse 90   Ht 5\' 5"  (1.651 m)   Wt 183 lb (83 kg)   SpO2 99%   BMI 30.45 kg/m  BP Readings from Last 3 Encounters:  01/05/23 120/72  07/06/22 111/69   01/14/22 110/71   Wt Readings from Last 3 Encounters:  01/05/23 183 lb (83 kg)  07/06/22 196 lb (88.9 kg)  01/14/22 196 lb (88.9 kg)      Physical Exam Constitutional:      Appearance: Normal appearance. She is obese.  HENT:     Head: Normocephalic.  Cardiovascular:     Rate and Rhythm: Normal rate and regular rhythm.     Heart sounds: Normal heart sounds.  Pulmonary:     Effort: Pulmonary effort is normal.     Breath sounds: Normal breath sounds.  Musculoskeletal:     Right lower leg: No edema.     Left lower leg: No edema.  Neurological:     General: No focal deficit present.     Mental Status: She is alert and oriented to person, place, and time.  Psychiatric:        Mood and Affect: Mood normal.      Results for orders placed or performed in visit on 01/05/23  POCT HgB A1C  Result Value Ref Range   Hemoglobin A1C 6.4 (A) 4.0 - 5.6 %   HbA1c POC (<> result, manual entry)     HbA1c, POC (prediabetic range)     HbA1c, POC (controlled diabetic range)       The 10-year ASCVD risk score (Arnett DK, et al.,  2019) is: 13.5%    Assessment & Plan:  [name] KitchenMarland KitchenGeanette was seen today for medical management of chronic issues.  Diagnoses and all orders for this visit:  Controlled type 2 diabetes mellitus without complication, without long-term current use of insulin (HCC) -     POCT HgB A1C -     dapagliflozin propanediol (FARXIGA) 10 MG TABS tablet; Take 1 tablet (10 mg total) by mouth daily. -     lisinopril (ZESTRIL) 10 MG tablet; TAKE 1 TABLET BY MOUTH EVERY DAY. -     pioglitazone (ACTOS) 30 MG tablet; Take 1 tablet (30 mg total) by mouth daily. -     Lipid panel -     CMP14+EGFR  Essential hypertension, benign -     lisinopril (ZESTRIL) 10 MG tablet; TAKE 1 TABLET BY MOUTH EVERY DAY. -     Lipid panel -     CMP14+EGFR  Dyslipidemia, goal LDL below 70 -     Lipid panel -     CMP14+EGFR   A1C to goal.  Continue same medications BP to goal On statin and  repatha-check lipid level today Foot and eye exam UTD Will get flu shot at work Follow up in 6 months   Return in about 6 months (around 07/06/2023).    Tammy Gaw, PA-C

## 2023-01-06 ENCOUNTER — Other Ambulatory Visit: Payer: Self-pay

## 2023-01-06 DIAGNOSIS — R944 Abnormal results of kidney function studies: Secondary | ICD-10-CM

## 2023-01-06 DIAGNOSIS — E119 Type 2 diabetes mellitus without complications: Secondary | ICD-10-CM

## 2023-01-06 LAB — LIPID PANEL
Chol/HDL Ratio: 2.3 ratio (ref 0.0–4.4)
Cholesterol, Total: 145 mg/dL (ref 100–199)
HDL: 64 mg/dL (ref >39)
LDL Chol Calc (NIH): 66 mg/dL (ref 0–99)
Triglycerides: 74 mg/dL (ref 0–149)
VLDL Cholesterol Cal: 15 mg/dL (ref 5–40)

## 2023-01-06 LAB — CMP14+EGFR
ALT: 10 [IU]/L (ref 0–32)
AST: 14 [IU]/L (ref 0–40)
Albumin: 4.4 g/dL (ref 3.9–4.9)
Alkaline Phosphatase: 62 [IU]/L (ref 44–121)
BUN/Creatinine Ratio: 21 (ref 12–28)
BUN: 23 mg/dL (ref 8–27)
Bilirubin Total: 0.5 mg/dL (ref 0.0–1.2)
CO2: 21 mmol/L (ref 20–29)
Calcium: 9.7 mg/dL (ref 8.7–10.3)
Chloride: 105 mmol/L (ref 96–106)
Creatinine, Ser: 1.08 mg/dL — ABNORMAL HIGH (ref 0.57–1.00)
Globulin, Total: 2.7 g/dL (ref 1.5–4.5)
Glucose: 90 mg/dL (ref 70–99)
Potassium: 4.3 mmol/L (ref 3.5–5.2)
Sodium: 142 mmol/L (ref 134–144)
Total Protein: 7.1 g/dL (ref 6.0–8.5)
eGFR: 58 mL/min/{1.73_m2} — ABNORMAL LOW (ref >59)

## 2023-01-06 NOTE — Progress Notes (Signed)
Dx code of decreased GFR and T2DM.

## 2023-01-06 NOTE — Progress Notes (Signed)
Tammy Bray,   LDL to goal looks great.  Kidneys look dry. You need to drink more water.  Recheck lab only kidney function in 3 months.  Stay on same medications

## 2023-03-05 NOTE — Telephone Encounter (Signed)
 Results abstracted and Care Team updated

## 2023-07-06 ENCOUNTER — Ambulatory Visit: Payer: BC Managed Care – PPO | Admitting: Physician Assistant

## 2023-07-08 ENCOUNTER — Other Ambulatory Visit: Payer: Self-pay | Admitting: Physician Assistant

## 2023-07-08 DIAGNOSIS — E785 Hyperlipidemia, unspecified: Secondary | ICD-10-CM

## 2023-08-22 ENCOUNTER — Other Ambulatory Visit: Payer: Self-pay | Admitting: Physician Assistant

## 2023-09-07 ENCOUNTER — Ambulatory Visit (INDEPENDENT_AMBULATORY_CARE_PROVIDER_SITE_OTHER): Admitting: Physician Assistant

## 2023-09-07 ENCOUNTER — Encounter: Payer: Self-pay | Admitting: Physician Assistant

## 2023-09-07 VITALS — BP 106/69 | HR 78 | Ht 65.0 in | Wt 200.8 lb

## 2023-09-07 DIAGNOSIS — E785 Hyperlipidemia, unspecified: Secondary | ICD-10-CM | POA: Diagnosis not present

## 2023-09-07 DIAGNOSIS — I10 Essential (primary) hypertension: Secondary | ICD-10-CM | POA: Diagnosis not present

## 2023-09-07 DIAGNOSIS — Z6833 Body mass index (BMI) 33.0-33.9, adult: Secondary | ICD-10-CM

## 2023-09-07 DIAGNOSIS — E119 Type 2 diabetes mellitus without complications: Secondary | ICD-10-CM

## 2023-09-07 DIAGNOSIS — E66811 Obesity, class 1: Secondary | ICD-10-CM | POA: Diagnosis not present

## 2023-09-07 DIAGNOSIS — Z7984 Long term (current) use of oral hypoglycemic drugs: Secondary | ICD-10-CM

## 2023-09-07 DIAGNOSIS — E6609 Other obesity due to excess calories: Secondary | ICD-10-CM

## 2023-09-07 LAB — POCT GLYCOSYLATED HEMOGLOBIN (HGB A1C): Hemoglobin A1C: 5.9 % — AB (ref 4.0–5.6)

## 2023-09-07 LAB — POCT UA - MICROALBUMIN
Albumin/Creatinine Ratio, Urine, POC: <30
Creatinine, POC: 50 mg/dL
Microalbumin Ur, POC: 10 mg/L

## 2023-09-07 MED ORDER — LISINOPRIL 10 MG PO TABS: ORAL_TABLET | ORAL | 1 refills | Status: DC

## 2023-09-07 MED ORDER — DAPAGLIFLOZIN PROPANEDIOL 10 MG PO TABS: 10.0000 mg | ORAL_TABLET | Freq: Every day | ORAL | 1 refills | Status: DC

## 2023-09-07 MED ORDER — PIOGLITAZONE HCL 30 MG PO TABS: 30.0000 mg | ORAL_TABLET | Freq: Every day | ORAL | 1 refills | Status: DC

## 2023-09-07 NOTE — Patient Instructions (Signed)
Continue to work on weight loss and regular exercise.

## 2023-09-07 NOTE — Progress Notes (Signed)
 Established Patient Office Visit  Subjective   Patient ID: Tammy Bray, female    DOB: 1961/01/13  Age: 63 y.o. MRN: [id number]  Chief Complaint  Patient presents with   Diabetes    DM follow up visit. Last A1C was 6.4 on 01/05/2023 . Patient states she had a normal Mammogram in Sept 2024 with [facility]. Patient declines vaccine.     HPI Pt is a 63 yo obese female with T2DM, HTN, HLD who presents to the clinic for 3 month follow up.   She has not been checking her sugars. She is compliant with farxiga  and actos . She has tried to do a little better with diet but has not exercises. She denies any CP, palpitations, headaches or vision changes. She does not check her BP at home.   She states she had mammogram in 9/24 with [facility].    ROS See HPI.    Objective:     BP 106/69   Pulse 78   Ht 5' 5 (1.651 m)   Wt 200 lb 12 oz (91.1 kg)   SpO2 100%   BMI 33.41 kg/m  BP Readings from Last 3 Encounters:  09/07/23 106/69  01/05/23 120/72  07/06/22 111/69   Wt Readings from Last 3 Encounters:  09/07/23 200 lb 12 oz (91.1 kg)  01/05/23 183 lb (83 kg)  07/06/22 196 lb (88.9 kg)    .Tammy Bray Results for orders placed or performed in visit on 09/07/23  POCT glycosylated hemoglobin (Hb A1C)   Collection Time: 09/07/23  9:02 AM  Result Value Ref Range   Hemoglobin A1C 5.9 (A) 4.0 - 5.6 %   HbA1c POC (<> result, manual entry)     HbA1c, POC (prediabetic range)     HbA1c, POC (controlled diabetic range)    POCT UA - Microalbumin   Collection Time: 09/07/23  9:16 AM  Result Value Ref Range   Microalbumin Ur, POC 10 mg/L   Creatinine, POC 50 mg/dL   Albumin/Creatinine Ratio, Urine, POC <30      Physical Exam Constitutional:      Appearance: Normal appearance. She is obese.  HENT:     Head: Normocephalic.   Neurological:     Mental Status: She is alert.   Psychiatric:        Mood and Affect: Mood normal.       The 10-year ASCVD risk score (Arnett DK, et al., 2019) is:  8.3%    Assessment & Plan:  SABRASABRAAshlei was seen today for diabetes.  Diagnoses and all orders for this visit:  Controlled type 2 diabetes mellitus without complication, without long-term current use of insulin (HCC) -     POCT UA - Microalbumin -     POCT glycosylated hemoglobin (Hb A1C) -     lisinopril  (ZESTRIL ) 10 MG tablet; TAKE 1 TABLET BY MOUTH EVERY DAY. -     pioglitazone  (ACTOS ) 30 MG tablet; Take 1 tablet (30 mg total) by mouth daily. -     dapagliflozin  propanediol (FARXIGA ) 10 MG TABS tablet; Take 1 tablet (10 mg total) by mouth daily. -     CMP14+EGFR  Essential hypertension, benign -     lisinopril  (ZESTRIL ) 10 MG tablet; TAKE 1 TABLET BY MOUTH EVERY DAY.  Class 1 obesity due to excess calories with serious comorbidity and body mass index (BMI) of 33.0 to 33.9 in adult  Dyslipidemia, goal LDL below 70   A1C improved which is great and to goal Continue on farxiga  and actos   Gained 20lbs since last visit Discussed increasing protein in diet and at least 150 minutes of exercise a week BP to goal, on ACE-refilled lisinopril  today Microalbumin normal today On statin and repatha , LDL UTD Eye and foot exam UTD Pt declined shingles/pneumonia/covid vaccine Follow up in 3 months   Encouraged patient to schedule mammogram, last mammogram is 04/2022.    Return in about 3 months (around 12/08/2023) for weight follow.    Markitta Ausburn, PA-C

## 2023-09-08 ENCOUNTER — Ambulatory Visit: Payer: Self-pay | Admitting: Physician Assistant

## 2023-09-08 LAB — CMP14+EGFR
ALT: 14 IU/L (ref 0–32)
AST: 17 IU/L (ref 0–40)
Albumin: 4.2 g/dL (ref 3.9–4.9)
Alkaline Phosphatase: 78 IU/L (ref 44–121)
BUN/Creatinine Ratio: 20 (ref 12–28)
BUN: 20 mg/dL (ref 8–27)
Bilirubin Total: 0.6 mg/dL (ref 0.0–1.2)
CO2: 19 mmol/L — ABNORMAL LOW (ref 20–29)
Calcium: 9.4 mg/dL (ref 8.7–10.3)
Chloride: 106 mmol/L (ref 96–106)
Creatinine, Ser: 0.99 mg/dL (ref 0.57–1.00)
Globulin, Total: 2.7 g/dL (ref 1.5–4.5)
Glucose: 110 mg/dL — ABNORMAL HIGH (ref 70–99)
Potassium: 4.3 mmol/L (ref 3.5–5.2)
Sodium: 143 mmol/L (ref 134–144)
Total Protein: 6.9 g/dL (ref 6.0–8.5)
eGFR: 64 mL/min/{1.73_m2} (ref >59)

## 2023-09-08 NOTE — Progress Notes (Signed)
 Kidney function improved! Great news. Labs look good.

## 2023-10-05 ENCOUNTER — Telehealth: Payer: Self-pay

## 2023-10-05 NOTE — Telephone Encounter (Signed)
 Sent patient a Mychart message that she could check online with the manufacturer website to see if qualifies for savings card.

## 2023-10-05 NOTE — Telephone Encounter (Signed)
 Copied from CRM [id number]. Topic: Clinical - Medication Question >> Oct 04, 2023  1:18 PM Corin V wrote: Reason for CRM: Patient is wanting to know if there are any coupons available for the dapagliflozin  propanediol (FARXIGA ) 10 MG TABS tablet. Please call with any options and steps to receive them

## 2023-12-10 ENCOUNTER — Ambulatory Visit: Admitting: Physician Assistant

## 2023-12-25 ENCOUNTER — Other Ambulatory Visit: Payer: Self-pay | Admitting: Physician Assistant

## 2024-01-04 ENCOUNTER — Other Ambulatory Visit: Payer: Self-pay | Admitting: Physician Assistant

## 2024-02-01 ENCOUNTER — Telehealth: Payer: Self-pay

## 2024-02-01 ENCOUNTER — Encounter: Payer: Self-pay | Admitting: Physician Assistant

## 2024-02-01 NOTE — Telephone Encounter (Signed)
 Attempted call to patient to clarify the return call. Left a voice mail message requesting a return call.

## 2024-02-01 NOTE — Telephone Encounter (Signed)
 Tammy Bray's record indicates she is due for a mammogram. Unable to leave a message. Voicemail is full.

## 2024-02-01 NOTE — Telephone Encounter (Signed)
 Patent returned the call for a mammogram. Please return patients call

## 2024-02-14 ENCOUNTER — Ambulatory Visit: Admitting: Physician Assistant

## 2024-02-14 ENCOUNTER — Encounter: Payer: Self-pay | Admitting: Physician Assistant

## 2024-02-14 VITALS — BP 109/61 | HR 72 | Ht 65.0 in | Wt 201.0 lb

## 2024-02-14 DIAGNOSIS — Z1231 Encounter for screening mammogram for malignant neoplasm of breast: Secondary | ICD-10-CM | POA: Diagnosis not present

## 2024-02-14 DIAGNOSIS — E785 Hyperlipidemia, unspecified: Secondary | ICD-10-CM | POA: Diagnosis not present

## 2024-02-14 DIAGNOSIS — E1169 Type 2 diabetes mellitus with other specified complication: Secondary | ICD-10-CM

## 2024-02-14 DIAGNOSIS — I10 Essential (primary) hypertension: Secondary | ICD-10-CM | POA: Diagnosis not present

## 2024-02-14 DIAGNOSIS — Z7984 Long term (current) use of oral hypoglycemic drugs: Secondary | ICD-10-CM

## 2024-02-14 DIAGNOSIS — E119 Type 2 diabetes mellitus without complications: Secondary | ICD-10-CM | POA: Diagnosis not present

## 2024-02-14 LAB — HM MAMMOGRAPHY

## 2024-02-14 LAB — POCT GLYCOSYLATED HEMOGLOBIN (HGB A1C): Hemoglobin A1C: 6.2 % — AB (ref 4.0–5.6)

## 2024-02-14 MED ORDER — PIOGLITAZONE HCL 30 MG PO TABS: 30.0000 mg | ORAL_TABLET | Freq: Every day | ORAL | 1 refills | Status: AC

## 2024-02-14 MED ORDER — DAPAGLIFLOZIN PROPANEDIOL 10 MG PO TABS: 10.0000 mg | ORAL_TABLET | Freq: Every day | ORAL | 1 refills | Status: DC

## 2024-02-14 MED ORDER — LISINOPRIL 10 MG PO TABS: ORAL_TABLET | ORAL | 1 refills | Status: AC

## 2024-02-14 NOTE — Progress Notes (Unsigned)
 Established Patient Office Visit  Subjective   Patient ID: Tammy Bray, female    DOB: 08-18-1960  Age: 63 y.o. MRN: [id number]  Chief Complaint  Patient presents with   Medical Management of Chronic Issues    Last A1c was 5.9    HPI Discussed the use of AI scribe software for clinical note transcription with the patient, who gave verbal consent to proceed.  History of Present Illness Tammy Bray is a 63 year old female with type 2 diabetes, hypertension, and hyperlipidemia who presents for a three month follow-up.  Glycemic control - Type 2 diabetes mellitus with most recent HbA1c of 6.2% (previously 5.7%) - Home blood glucose readings around 130 mg/dL - No reported side effects from current diabetes medications - Foot exam is up to date - Eye appointment scheduled for September; results pending  Hypertension and hyperlipidemia management - No reported side effects from current antihypertensive or lipid-lowering medications  Musculoskeletal pain - Transient pain affecting the entire right side and backside, resolved spontaneously - Pain occurred after driving six to seven hours last week - No residual symptoms  Physical activity - Not as active as desired, but attempts to increase activity by walking laps after meals  Preventive health maintenance - Declined pneumonia and COVID vaccines today - Received influenza vaccine at work - Mammogram scheduled for today at 3 PM  Constitutional and cardiopulmonary symptoms - No swelling, chest pain, or shortness of breath    ROS See HPI.    Objective:     BP 109/61   Pulse 72   Ht 5' 5 (1.651 m)   Wt 201 lb (91.2 kg)   SpO2 99%   BMI 33.45 kg/m  BP Readings from Last 3 Encounters:  02/14/24 109/61  09/07/23 106/69  01/05/23 120/72   Wt Readings from Last 3 Encounters:  02/14/24 201 lb (91.2 kg)  09/07/23 200 lb 12 oz (91.1 kg)  01/05/23 183 lb (83 kg)    .SABRA Lab Results  Component Value Date    HGBA1C 6.2 (A) 02/14/2024     Physical Exam Constitutional:      Appearance: Normal appearance. She is obese.  HENT:     Head: Normocephalic.  Neck:     Vascular: No carotid bruit.  Cardiovascular:     Rate and Rhythm: Normal rate and regular rhythm.  Pulmonary:     Effort: Pulmonary effort is normal.     Breath sounds: Normal breath sounds.  Musculoskeletal:     Cervical back: Normal range of motion and neck supple. No tenderness.     Right lower leg: No edema.     Left lower leg: No edema.  Neurological:     General: No focal deficit present.     Mental Status: She is alert and oriented to person, place, and time.  Psychiatric:        Mood and Affect: Mood normal.      The 10-year ASCVD risk score (Arnett DK, et al., 2019) is: 8.9%    Assessment & Plan:  .Tammy Bray was seen today for medical management of chronic issues.  Diagnoses and all orders for this visit:  Hyperlipidemia associated with type 2 diabetes mellitus (HCC) -     POCT HgB A1C -     CMP14+EGFR -     Lipid panel -     dapagliflozin  propanediol (FARXIGA ) 10 MG TABS tablet; Take 1 tablet (10 mg total) by mouth daily. -     pioglitazone  (ACTOS ) 30  MG tablet; Take 1 tablet (30 mg total) by mouth daily. -     lisinopril  (ZESTRIL ) 10 MG tablet; TAKE 1 TABLET BY MOUTH EVERY DAY.  Essential hypertension, benign -     CMP14+EGFR -     lisinopril  (ZESTRIL ) 10 MG tablet; TAKE 1 TABLET BY MOUTH EVERY DAY.  Dyslipidemia, goal LDL below 70 -     Lipid panel  Morbid obesity (HCC)   Assessment & Plan Hyperlipidemia associated with T2DM A1c increased to 6.2%, indicating worsening glycemic control. Home glucose ~130 mg/dL. - Continue Actos  and farxiga .  - Ordered lipid panel and CMP. - Encouraged increased physical activity post-meals. - Advised more frequent glucose monitoring.  Essential hypertension Blood pressure well-controlled with current regimen with lisinopril .  No side effects  reported.  Hyperlipidemia - Ordered lipid panel. - Continue rousuvastatin and repatha .   General Health Maintenance Declined pneumonia, flu, and COVID vaccines. Mammogram scheduled. Eye exam completed in September. - Encouraged pneumonia vaccine consideration post-60. - Obtained September eye exam copy. - Proceed with mammogram.     Return in about 6 months (around 08/14/2024).    Tammy Mancha, PA-C

## 2024-02-14 NOTE — Patient Instructions (Signed)

## 2024-02-15 ENCOUNTER — Ambulatory Visit: Payer: Self-pay | Admitting: Physician Assistant

## 2024-02-15 LAB — CMP14+EGFR
ALT: 11 IU/L (ref 0–32)
AST: 16 IU/L (ref 0–40)
Albumin: 4.1 g/dL (ref 3.9–4.9)
Alkaline Phosphatase: 67 IU/L (ref 49–135)
BUN/Creatinine Ratio: 16 (ref 12–28)
BUN: 15 mg/dL (ref 8–27)
Bilirubin Total: 0.6 mg/dL (ref 0.0–1.2)
CO2: 21 mmol/L (ref 20–29)
Calcium: 9.5 mg/dL (ref 8.7–10.3)
Chloride: 107 mmol/L — ABNORMAL HIGH (ref 96–106)
Creatinine, Ser: 0.96 mg/dL (ref 0.57–1.00)
Globulin, Total: 2.8 g/dL (ref 1.5–4.5)
Glucose: 88 mg/dL (ref 70–99)
Potassium: 4.3 mmol/L (ref 3.5–5.2)
Sodium: 143 mmol/L (ref 134–144)
Total Protein: 6.9 g/dL (ref 6.0–8.5)
eGFR: 66 mL/min/1.73 (ref >59)

## 2024-02-15 LAB — LIPID PANEL
Chol/HDL Ratio: 2.1 ratio (ref 0.0–4.4)
Cholesterol, Total: 149 mg/dL (ref 100–199)
HDL: 70 mg/dL (ref >39)
LDL Chol Calc (NIH): 67 mg/dL (ref 0–99)
Triglycerides: 55 mg/dL (ref 0–149)
VLDL Cholesterol Cal: 12 mg/dL (ref 5–40)

## 2024-02-15 NOTE — Progress Notes (Signed)
 [place],   Kidney, liver, glucose look good.  Cholesterol to goal!

## 2024-02-24 ENCOUNTER — Encounter: Payer: Self-pay | Admitting: Physician Assistant

## 2024-03-22 ENCOUNTER — Telehealth: Payer: Self-pay

## 2024-03-22 NOTE — Telephone Encounter (Signed)
 Copied from CRM [id number]. Topic: General - Other >> Mar 22, 2024 12:02 PM Geneva B wrote: Reason for CRM: pt is calling to give information for [facility] [phone] to call and have the info needed to switch pharmacy please call pt pt back if nay concerns [phone]

## 2024-03-22 NOTE — Telephone Encounter (Signed)
 Spoke with the pt and advised her [facility] should be able to [facility]

## 2024-04-12 ENCOUNTER — Other Ambulatory Visit (HOSPITAL_COMMUNITY): Payer: Self-pay

## 2024-04-12 ENCOUNTER — Telehealth: Payer: Self-pay

## 2024-04-12 NOTE — Telephone Encounter (Signed)
"  °  Received notification from Physician's Office that prior authorization for FARXIGA  is required/requested.   Insurance verification completed.   The patient is insured through [employer].   Per test claim:  JARDIANCE is preferred by the insurance.  If suggested medication is appropriate, Please send in a new RX and discontinue this one. If not, please advise as to why it's not appropriate so that we may request a Prior Authorization. Please note, some preferred medications may still require a PA.  If the suggested medications have not been trialed and there are no contraindications to their use, the PA will not be submitted, as it will not be approved.  Plan covers Jardiance without a PA, copay is $48 for 30 day supply.    "

## 2024-04-12 NOTE — Telephone Encounter (Signed)
 Good Morning, could it be possible to look into prior authorization requested for DAPAGLIFLOZIN  10 MG (FARXIGA ) ? New request form is in the process of being scanned into chart.

## 2024-04-12 NOTE — Telephone Encounter (Signed)
 Farxiga  and Jardiance are the same type of medication. I would like her to be on which ever one is themost affordable.

## 2024-04-12 NOTE — Telephone Encounter (Signed)
 I have contacted Tammy Bray, whom has been advised of update, states she would like to move forward with JARDIANCE. Pt states there has been a change in pharmacy. I have updated and placed pharmacy below.   [facility] - [place], [place] - [address]

## 2024-04-12 NOTE — Telephone Encounter (Signed)
 You're the best!  I'm so used to cards being scanned into the same index I completely missed the [employer]. That was exactly what I needed. Thank you!   Has patient tried Jardiance or are they able to use Jardiance?

## 2024-04-12 NOTE — Telephone Encounter (Signed)
 Of course !  I do not happen to see any mention of JARDIANCE (Empagliflozin) in here chart, present or past. I do know she has been taking DAPAGLIFLOZIN  (FARXIGA ) and PIOGLITAZONE  (ACTOS ) since 2016 to present.

## 2024-04-12 NOTE — Telephone Encounter (Signed)
 RX coverage is not pulling for patient. Mychart message sent requesting updated insurance information.

## 2024-04-12 NOTE — Telephone Encounter (Signed)
 Hi Tammy Bray,   I believe the PA request was sent through [employer]. Under the media tab there are 2 insurance cards scanned into the patients chart. One is for [facility] and the other is [employer]. I am not sure if that helps much but I just wanted to reach out and let you know.  Thank you!

## 2024-04-14 MED ORDER — EMPAGLIFLOZIN 10 MG PO TABS: 10.0000 mg | ORAL_TABLET | Freq: Every day | ORAL | 3 refills | Status: AC

## 2024-04-14 NOTE — Telephone Encounter (Signed)
 [facility] Jardiance  10mg  [facility]

## 2024-04-14 NOTE — Addendum Note (Signed)
 Addended by: ANTONIETTE VERMELL CROME on: 04/14/2024 12:36 PM   Modules accepted: Orders

## 2024-04-17 ENCOUNTER — Ambulatory Visit: Payer: Self-pay

## 2024-04-19 ENCOUNTER — Telehealth: Payer: Self-pay

## 2024-04-19 DIAGNOSIS — E785 Hyperlipidemia, unspecified: Secondary | ICD-10-CM

## 2024-04-19 MED ORDER — ROSUVASTATIN CALCIUM 40 MG PO TABS: 40.0000 mg | ORAL_TABLET | Freq: Every day | ORAL | 3 refills | Status: AC

## 2024-04-19 MED ORDER — REPATHA SURECLICK 140 MG/ML ~~LOC~~ SOAJ: 140.0000 mg | SUBCUTANEOUS | 1 refills | Status: AC

## 2024-04-19 NOTE — Telephone Encounter (Signed)
 Copied from CRM [id number]. Topic: Clinical - Medication Question >> Apr 19, 2024  8:48 AM Antwanette L wrote: Reason for CRM: [place] from [facility] called requesting a 90 days supply  with 3 refills  for rosuvastatin  (CRESTOR ) 40 MG tablet,  dapagliflozin  10 mg tablet, and Evolocumab  (REPATHA  SURECLICK) 140 MG/ML SOAJ . Theodoro is requesting that these prescriptions be e-scribed. Theodoro can be reached at [phone]

## 2024-04-20 ENCOUNTER — Ambulatory Visit: Payer: Self-pay

## 2024-04-20 NOTE — Telephone Encounter (Signed)
 Patient shows scheduled for 04/21/2024 with Jade Breeback, PA

## 2024-04-20 NOTE — Telephone Encounter (Signed)
 FYI Only or Action Required?: FYI only for provider: appointment scheduled on 2/6.  Patient was last seen in primary care on 02/14/2024 by Antoniette Vermell CROME, PA-C.  Called Nurse Triage reporting Dysuria.  Symptoms began several days ago.  Interventions attempted: Prescription medications: keflex.  Symptoms are: unchanged.  Triage Disposition: See Physician Within 24 Hours  Patient/caregiver understands and will follow disposition?: Yes   Saturday onset of frequent urination. No fever. No burning with urination, just discomfort in pelvis area. Nof flank pain. No blood in urine. Urine a little cloudy. 2/3 went on UC. Prescribed keflex. Stopped taking once urine culture came back negative. Scheduled appt with PCP tomorrow. Advised UC or ED for worsening symptoms.    Message from [place] S sent at 04/20/2024 10:32 AM EST  Reason for Triage: Frequent urination, irritation   Reason for Disposition  Urinating more frequently than usual (i.e., frequency) OR new-onset of the feeling of an urgent need to urinate (i.e., urgency)  Answer Assessment - Initial Assessment Questions 1. SYMPTOM: What's the main symptom you're concerned about? (e.g., frequency, incontinence)     Urinary frequency  2. ONSET: When did the  Urinary frequency  start?     Saturday  3. PAIN: Is there any pain? If Yes, ask: How bad is it? (Scale: 1-10; mild, moderate, severe)     No  4. CAUSE: What do you think is causing the symptoms?     No  5. OTHER SYMPTOMS: Do you have any other symptoms? (e.g., blood in urine, fever, flank pain, pain with urination)     No  Protocols used: Urinary Symptoms-A-AH

## 2024-04-21 ENCOUNTER — Ambulatory Visit: Admitting: Physician Assistant

## 2024-04-21 VITALS — BP 114/72 | HR 93 | Wt 203.8 lb

## 2024-04-21 DIAGNOSIS — R300 Dysuria: Secondary | ICD-10-CM

## 2024-04-21 DIAGNOSIS — N898 Other specified noninflammatory disorders of vagina: Secondary | ICD-10-CM

## 2024-04-21 LAB — POCT URINALYSIS DIP (CLINITEK)
Bilirubin, UA: NEGATIVE
Glucose, UA: 500 mg/dL — AB
Ketones, POC UA: NEGATIVE mg/dL
Leukocytes, UA: NEGATIVE
Nitrite, UA: NEGATIVE
POC PROTEIN,UA: NEGATIVE
Spec Grav, UA: 1.015
Urobilinogen, UA: 0.2 U/dL
pH, UA: 6

## 2024-04-21 MED ORDER — FLUCONAZOLE 150 MG PO TABS: 150.0000 mg | ORAL_TABLET | Freq: Once | ORAL | 0 refills | Status: AC

## 2024-04-21 MED ORDER — ESTRADIOL 0.01 % VA CREA: TOPICAL_CREAM | VAGINAL | 1 refills | Status: AC

## 2024-04-21 NOTE — Patient Instructions (Addendum)
 Will go ahead and treat for yeast and wait on vaginal culture to come back.   Start vaginal estrogen three times a week.   Vaginal Dryness and Thinning (Atrophic Vaginitis): What to Know  Atrophic vaginitis is when the lining of the vagina becomes dry, thin, and inflamed. Tell your doctor if you notice any changes in your vagina. They can help you find ways to feel better. They can also treat infections and suggest healthy habits for a healthy vagina. What are the causes? This condition is caused by a drop in estrogen. This affects the moisture in the lining of your vagina. When estrogen levels are low, the tissues become dry. This is most common when menstrual periods stop during menopause. What increases the risk? You're more likely to get this condition if: You take medicines that block estrogen. You've had your ovaries taken out. You're being treated for cancer. You recently had a baby. You're breastfeeding. You're over 36 years old. You have an eating disorder. You smoke. What are the signs or symptoms? Pain during sex. Soreness during sex. Bleeding during sex. Burning or itching around your vagina. Loss of interest in sex. Burning pain when you pee. Peeing often. Fluid coming from your vagina that isn't normal. It may be: White. [place]. Yellow. Tinted with blood. Thick. Watery. Some people don't have symptoms. How is this treated? Using a lubricant before sex. Using a moisturizer in the vagina. Using estrogen in the vagina. You may not need treatment if your symptoms are mild or you're not having sex. Follow these instructions at home: Medicines Take your medicines only as told. Do not use herbal or other medicines unless your doctor says it's safe. Use creams, lubricants, or moisturizers only as told. General instructions Talk with your doctor about any menopause symptoms and treatment options. Do not douche. Do not use scented: Sprays. Tampons. Soaps. If sex  hurts, try using lubricants right before you have sex. Contact a doctor if: You have fluid coming from the vagina that's not normal. You have a smell coming from your vagina. You have new symptoms. Your symptoms don't get better with treatment. Your symptoms get worse. This information is not intended to replace advice given to you by your health care provider. Make sure you discuss any questions you have with your health care provider. Document Revised: 10/12/2022 Document Reviewed: 10/12/2022 Elsevier Patient Education  2024 [employer].

## 2024-04-21 NOTE — Progress Notes (Unsigned)
" ° °  Acute Office Visit  Subjective:     Patient ID: Tammy Bray, female    DOB: 11-23-1960, 64 y.o.   MRN: [id number]  Chief Complaint  Patient presents with   Dysuria    Onset Sat pa states she's having irritation night    HPI Patient is in today for ***  ROS      Objective:    BP 114/72   Pulse 93   Wt 203 lb 12 oz (92.4 kg)   SpO2 99%   BMI 33.91 kg/m  {Vitals History (Optional):23777}  Physical Exam  No results found for any visits on 04/21/24.      Assessment & Plan:   Problem List Items Addressed This Visit   None Visit Diagnoses       Dysuria    -  Primary   Relevant Orders   POCT URINALYSIS DIP (CLINITEK)   Urine Culture       No orders of the defined types were placed in this encounter.   No follow-ups on file.  Eulala Newcombe, PA-C   "

## 2024-08-14 ENCOUNTER — Ambulatory Visit: Admitting: Physician Assistant
